# Patient Record
Sex: Male | Born: 1960 | Race: White | Hispanic: No | Marital: Married | Smoking: Former smoker
Health system: Southern US, Community
[De-identification: ages and names within clinical notes are randomized; demographics above are authoritative.]

## PROBLEM LIST (undated history)

## (undated) DIAGNOSIS — F419 Anxiety disorder, unspecified: Secondary | ICD-10-CM

## (undated) DIAGNOSIS — J309 Allergic rhinitis, unspecified: Secondary | ICD-10-CM

## (undated) DIAGNOSIS — R519 Headache, unspecified: Secondary | ICD-10-CM

## (undated) DIAGNOSIS — F909 Attention-deficit hyperactivity disorder, unspecified type: Secondary | ICD-10-CM

## (undated) DIAGNOSIS — G43909 Migraine, unspecified, not intractable, without status migrainosus: Secondary | ICD-10-CM

## (undated) HISTORY — DX: Allergic rhinitis, unspecified: J30.9

## (undated) HISTORY — DX: Anxiety disorder, unspecified: F41.9

## (undated) HISTORY — DX: Headache, unspecified: R51.9

## (undated) HISTORY — DX: Attention-deficit hyperactivity disorder, unspecified type: F90.9

## (undated) HISTORY — DX: Migraine, unspecified, not intractable, without status migrainosus: G43.909

---
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
## 1982-09-21 HISTORY — PX: KNEE SURGERY: SHX244

## 2013-11-17 LAB — HM COLONOSCOPY

## 2018-10-10 LAB — HM COLONOSCOPY

## 2019-04-03 HISTORY — PX: COLONOSCOPY: SHX174

## 2019-04-03 LAB — HM COLONOSCOPY

## 2020-08-07 ENCOUNTER — Ambulatory Visit (INDEPENDENT_AMBULATORY_CARE_PROVIDER_SITE_OTHER): Payer: BC Managed Care – PPO | Admitting: Family Medicine

## 2020-08-07 ENCOUNTER — Other Ambulatory Visit: Payer: Self-pay

## 2020-08-07 ENCOUNTER — Encounter: Payer: Self-pay | Admitting: Family Medicine

## 2020-08-07 VITALS — BP 119/70 | HR 80 | Temp 98.2°F | Resp 17 | Ht 73.5 in | Wt 207.6 lb

## 2020-08-07 DIAGNOSIS — R519 Headache, unspecified: Secondary | ICD-10-CM

## 2020-08-07 DIAGNOSIS — F419 Anxiety disorder, unspecified: Secondary | ICD-10-CM | POA: Insufficient documentation

## 2020-08-07 DIAGNOSIS — Z79899 Other long term (current) drug therapy: Secondary | ICD-10-CM | POA: Diagnosis not present

## 2020-08-07 DIAGNOSIS — Z0184 Encounter for antibody response examination: Secondary | ICD-10-CM | POA: Diagnosis not present

## 2020-08-07 DIAGNOSIS — Z7689 Persons encountering health services in other specified circumstances: Secondary | ICD-10-CM | POA: Insufficient documentation

## 2020-08-07 DIAGNOSIS — F909 Attention-deficit hyperactivity disorder, unspecified type: Secondary | ICD-10-CM | POA: Insufficient documentation

## 2020-08-07 NOTE — Patient Instructions (Signed)
Have your labs drawn and we will contact you with the results  We will plan to see you back in 3 months for your physical  You will receive a survey after today's visit either digitally by e-mail or paper by [place] mail. Your experiences and feedback matter to [place].  Please respond so we know how we are doing as we provide care for you.  Call us with any questions/concerns/needs.  It is my goal to be available to you for your health concerns.  Thanks for choosing me to be a partner in your healthcare needs!  Harlin Rain, FNP-C Family Nurse Practitioner [facility] Phone: [phone]

## 2020-08-07 NOTE — Assessment & Plan Note (Signed)
Currently stable and well controlled with PRN sumatriptan nasal spray.

## 2020-08-07 NOTE — Assessment & Plan Note (Signed)
New patient establishment at [facility] for primary care.  Previous PCP records to be requested.  Baseline labs ordered, to RTC in 3 months for CPE

## 2020-08-07 NOTE — Assessment & Plan Note (Signed)
TB skin test done today.  Labs for MMR and Hep B immunity ordered.

## 2020-08-07 NOTE — Progress Notes (Signed)
Subjective:    Patient ID: Ethan Glenn, male    DOB: 12-29-1960, 59 y.o.   MRN: [id number]  Ethan Glenn is a 59 y.o. male presenting on 08/07/2020 for Employment Physical   HPI  Mr. Troop presents to clinic as a new patient to establish for primary care.  Records will be requested from previous PCP office.  Past medical, family, and surgical history reviewed w/ pt.  Has acute concerns today for needing TB skin testing and labs for immunity for an employment physical.  No flowsheet data found.  Social History   Tobacco Use  . Smoking status: Former Smoker    Quit date: 08/07/1986    Years since quitting: 34.0  . Smokeless tobacco: Never Used  Vaping Use  . Vaping Use: Never used  Substance Use Topics  . Alcohol use: Yes    Alcohol/week: 3.0 standard drinks    Types: 3 Standard drinks or equivalent per week  . Drug use: Never    Review of Systems  Constitutional: Negative.   HENT: Negative.   Eyes: Negative.   Respiratory: Negative.   Cardiovascular: Negative.   Gastrointestinal: Negative.   Endocrine: Negative.   Genitourinary: Negative.   Musculoskeletal: Negative.   Skin: Negative.   Allergic/Immunologic: Negative.   Neurological: Negative.   Hematological: Negative.   Psychiatric/Behavioral: Negative.    Per HPI unless specifically indicated above     Objective:    BP 119/70 (BP Location: Right Arm, Patient Position: Sitting, Cuff Size: Large)   Pulse 80   Temp 98.2 F (36.8 C) (Oral)   Resp 17   Ht 6' 1.5" (1.867 m)   Wt 207 lb 9.6 oz (94.2 kg)   SpO2 100%   BMI 27.02 kg/m   Wt Readings from Last 3 Encounters:  08/07/20 207 lb 9.6 oz (94.2 kg)    Physical Exam Vitals and nursing note reviewed.  Constitutional:      General: He is not in acute distress.    Appearance: Normal appearance. He is well-developed and well-groomed. He is not ill-appearing or toxic-appearing.  HENT:     Head: Normocephalic and atraumatic.     Nose:      Comments: Lizbeth Bark is in place, covering mouth and nose. Eyes:     General:        Right eye: No discharge.        Left eye: No discharge.     Extraocular Movements: Extraocular movements intact.     Conjunctiva/sclera: Conjunctivae normal.     Pupils: Pupils are equal, round, and reactive to light.  Cardiovascular:     Rate and Rhythm: Normal rate and regular rhythm.     Pulses: Normal pulses.     Heart sounds: Normal heart sounds. No murmur heard.  No friction rub. No gallop.   Pulmonary:     Effort: Pulmonary effort is normal. No respiratory distress.     Breath sounds: Normal breath sounds.  Skin:    General: Skin is warm and dry.     Capillary Refill: Capillary refill takes less than 2 seconds.  Neurological:     General: No focal deficit present.     Mental Status: He is alert and oriented to person, place, and time.  Psychiatric:        Attention and Perception: Attention and perception normal.        Mood and Affect: Mood and affect normal.        Speech: Speech normal.  Behavior: Behavior normal. Behavior is cooperative.        Thought Content: Thought content normal.        Cognition and Memory: Cognition and memory normal.    No results found for this or any previous visit.    Assessment & Plan:   Problem List Items Addressed This Visit      Other   Encounter to establish care - Primary    New patient establishment at [facility] for primary care.  Previous PCP records to be requested.  Baseline labs ordered, to RTC in 3 months for CPE      Relevant Orders   PPD (Completed)   Anxiety    Currently stable and well controlled with escitalopram 42m daily.  Will continue.      Relevant Medications   escitalopram (LEXAPRO) 20 MG tablet   Other Relevant Orders   CBC with Differential   COMPLETE METABOLIC PANEL WITH GFR   TSH + free T4   Headache    Currently stable and well controlled with PRN sumatriptan nasal spray.      Relevant Medications    escitalopram (LEXAPRO) 20 MG tablet   SUMAtriptan (IMITREX) 20 MG/ACT nasal spray   Immunity status testing    TB skin test done today.  Labs for MMR and Hep B immunity ordered.      Relevant Orders   Measles/Mumps/Rubella Immunity   Hepatitis B core antibody, total    Other Visit Diagnoses    Long-term use of high-risk medication       Relevant Orders   Lipid Profile      No orders of the defined types were placed in this encounter.   Follow up plan: Return in about 3 months (around 11/07/2020) for CPE.   NHarlin Rain FHendersonvilleFamily Nurse Practitioner [facility] 08/07/2020, 11:25 AM

## 2020-08-07 NOTE — Assessment & Plan Note (Signed)
Currently stable and well controlled with escitalopram 20mg  daily.  Will continue.

## 2020-08-08 ENCOUNTER — Other Ambulatory Visit: Payer: Self-pay | Admitting: Family Medicine

## 2020-08-09 ENCOUNTER — Ambulatory Visit: Payer: Self-pay

## 2020-08-09 ENCOUNTER — Other Ambulatory Visit: Payer: Self-pay

## 2020-08-09 DIAGNOSIS — Z111 Encounter for screening for respiratory tuberculosis: Secondary | ICD-10-CM

## 2020-08-09 LAB — TEST AUTHORIZATION

## 2020-08-09 LAB — COMPLETE METABOLIC PANEL WITH GFR
AG Ratio: 1.7 (calc) (ref 1.0–2.5)
ALT: 27 U/L (ref 9–46)
AST: 20 U/L (ref 10–35)
Albumin: 4.3 g/dL (ref 3.6–5.1)
Alkaline phosphatase (APISO): 79 U/L (ref 35–144)
BUN: 12 mg/dL (ref 7–25)
CO2: 25 mmol/L (ref 20–32)
Calcium: 10.5 mg/dL — ABNORMAL HIGH (ref 8.6–10.3)
Chloride: 104 mmol/L (ref 98–110)
Creat: 1.06 mg/dL (ref 0.70–1.33)
GFR, Est African American: 90 mL/min/{1.73_m2} (ref >=60)
GFR, Est Non African American: 78 mL/min/{1.73_m2} (ref >=60)
Globulin: 2.5 g/dL (calc) (ref 1.9–3.7)
Glucose, Bld: 113 mg/dL — ABNORMAL HIGH (ref 65–99)
Potassium: 4.7 mmol/L (ref 3.5–5.3)
Sodium: 138 mmol/L (ref 135–146)
Total Bilirubin: 1.2 mg/dL (ref 0.2–1.2)
Total Protein: 6.8 g/dL (ref 6.1–8.1)

## 2020-08-09 LAB — CBC WITH DIFFERENTIAL/PLATELET
Absolute Monocytes: 488 cells/uL (ref 200–950)
Basophils Absolute: 42 cells/uL (ref 0–200)
Basophils Relative: 0.8 %
Eosinophils Absolute: 90 cells/uL (ref 15–500)
Eosinophils Relative: 1.7 %
HCT: 45.6 % (ref 38.5–50.0)
Hemoglobin: 14.8 g/dL (ref 13.2–17.1)
Lymphs Abs: 1399 cells/uL (ref 850–3900)
MCH: 28.8 pg (ref 27.0–33.0)
MCHC: 32.5 g/dL (ref 32.0–36.0)
MCV: 88.9 fL (ref 80.0–100.0)
MPV: 11.2 fL (ref 7.5–12.5)
Monocytes Relative: 9.2 %
Neutro Abs: 3281 cells/uL (ref 1500–7800)
Neutrophils Relative %: 61.9 %
Platelets: 357 10*3/uL (ref 140–400)
RBC: 5.13 10*6/uL (ref 4.20–5.80)
RDW: 12.4 % (ref 11.0–15.0)
Total Lymphocyte: 26.4 %
WBC: 5.3 10*3/uL (ref 3.8–10.8)

## 2020-08-09 LAB — HEPATITIS B CORE ANTIBODY, TOTAL: Hep B Core Total Ab: NONREACTIVE

## 2020-08-09 LAB — LIPID PANEL
Cholesterol: 200 mg/dL — ABNORMAL HIGH (ref <200)
HDL: 62 mg/dL (ref >=40)
LDL Cholesterol (Calc): 116 mg/dL (calc) — ABNORMAL HIGH
Non-HDL Cholesterol (Calc): 138 mg/dL (calc) — ABNORMAL HIGH (ref <130)
Total CHOL/HDL Ratio: 3.2 (calc) (ref <5.0)
Triglycerides: 112 mg/dL (ref <150)

## 2020-08-09 LAB — HEPATITIS B SURFACE ANTIBODY,QUALITATIVE: Hep B S Ab: REACTIVE — AB

## 2020-08-09 LAB — MEASLES/MUMPS/RUBELLA IMMUNITY
Mumps IgG: 258 AU/mL
Rubella: 23.2 Index
Rubeola IgG: 73 AU/mL

## 2020-08-09 LAB — TB SKIN TEST
Induration: 0 mm
TB Skin Test: NEGATIVE

## 2020-08-09 LAB — TSH+FREE T4: TSH W/REFLEX TO FT4: 1.94 mIU/L (ref 0.40–4.50)

## 2020-11-12 ENCOUNTER — Ambulatory Visit: Payer: Self-pay

## 2020-11-12 ENCOUNTER — Telehealth: Payer: Self-pay

## 2020-11-12 NOTE — Telephone Encounter (Signed)
Copied from [place] [id number]. Topic: Appointment Scheduling - Scheduling Inquiry for Clinic >> Nov 12, 2020  2:53 PM Oneta Rack wrote: Reason for CRM:  patient was not aware Ethan Glenn was no longer with the practice. Patient states he is happy with the service he has been receiving and would like to remain as a patient at [facility]. Please advise the patient directly regarding what is needed to remain as a patient. Patient wife is also a patient Ethan Glenn (Spouse)

## 2020-11-12 NOTE — Telephone Encounter (Signed)
Attempted to contact the patient to f/u on recent message. If the patient calls back please inform patient that we plan on keeping all [facility]  Current patient. We are in the process of hiring a new provider that will take over [facility] patient load.

## 2020-11-12 NOTE — Telephone Encounter (Signed)
Patient called and says he was diagnosed with COVID on 11/07/20 after onset of symptoms on 11/06/20-mild cough, scratchy throat. He says he also felt like mild flu symptom with temp as high as 99.6. He says today is his 5th day of quarantine and is supposed to go back to work tomorrow. He says he has not taken any medication in the past 24 hours and he was feeling better. He says he's calling because he went up the stairs in his home and says it was hard for him to catch his breath. He says his HR was up to low-mid 100's. He says it concerned him because all these 5 days he has not been SOB. He says he's supposed to go back to work, so needs to know if he should or not. I advised based on CDC guidelines if during the 5 days symptoms are not improving or get worse, to stay home additional 5 days. Advised virtual appointment tomorrow at 1400 with Dr. Parks Ranger, care advice given, patient verbalized understanding.  Reason for Disposition . MILD difficulty breathing (e.g., minimal/no SOB at rest, SOB with walking, pulse <100)  Answer Assessment - Initial Assessment Questions 1. COVID-19 DIAGNOSIS: "Who made your COVID-19 diagnosis?" "Was it confirmed by a positive lab test or self-test?" If not diagnosed by a doctor (or NP/PA), ask "Are there lots of cases (community spread) where you live?" Note: See public health department website, if unsure.     [facility] lab test 2. COVID-19 EXPOSURE: "Was there any known exposure to COVID before the symptoms began?" CDC Definition of close contact: within 6 feet (2 meters) for a total of 15 minutes or more over a 24-hour period.      Maybe my wife, not sure, her tests negative 3. ONSET: "When did the COVID-19 symptoms start?"      11/06/20-little cough, scratchy throat 4. WORST SYMPTOM: "What is your worst symptom?" (e.g., cough, fever, shortness of breath, muscle aches)     Shortness of breath 5. COUGH: "Do you have a cough?" If Yes, ask: "How bad is the cough?"        Yes 6. FEVER: "Do you have a fever?" If Yes, ask: "What is your temperature, how was it measured, and when did it start?"     No fever at this time 7. RESPIRATORY STATUS: "Describe your breathing?" (e.g., shortness of breath, wheezing, unable to speak)      Shortness of breath with activity starting today 8. BETTER-SAME-WORSE: "Are you getting better, staying the same or getting worse compared to yesterday?"  If getting worse, ask, "In what way?"     Better 9. HIGH RISK DISEASE: "Do you have any chronic medical problems?" (e.g., asthma, heart or lung disease, weak immune system, obesity, etc.)     No 10. VACCINE: "Have you had the COVID-19 vaccine?" If Yes, ask: "Which one, how many shots, when did you get it?"       3 shots 11. BOOSTER: "Have you received your COVID-19 booster?" If Yes, ask: "Which one and when did you get it?"       Moderna 12. PREGNANCY: "Is there any chance you are pregnant?" "When was your last menstrual period?"       N/A 13. OTHER SYMPTOMS: "Do you have any other symptoms?"  (e.g., chills, fatigue, headache, loss of smell or taste, muscle pain, sore throat)      Fatigue, slight cough, scratchy throat, post nasal drip, sinus congestion, runny nose 14. O2 SATURATION MONITOR:  "  Do you use an oxygen saturation monitor (pulse oximeter) at home?" If Yes, ask "What is your reading (oxygen level) today?" "What is your usual oxygen saturation reading?" (e.g., 95%)       Yes on my Apple watch, 100%  Protocols used: CORONAVIRUS (COVID-19) DIAGNOSED OR SUSPECTED-A-AH

## 2020-11-13 ENCOUNTER — Other Ambulatory Visit: Payer: Self-pay

## 2020-11-13 ENCOUNTER — Ambulatory Visit (INDEPENDENT_AMBULATORY_CARE_PROVIDER_SITE_OTHER): Payer: HRSA Program | Admitting: Family Medicine

## 2020-11-13 ENCOUNTER — Encounter: Payer: Self-pay | Admitting: Family Medicine

## 2020-11-13 VITALS — HR 140 | Ht 73.5 in | Wt 207.0 lb

## 2020-11-13 DIAGNOSIS — R000 Tachycardia, unspecified: Secondary | ICD-10-CM | POA: Diagnosis not present

## 2020-11-13 DIAGNOSIS — R0600 Dyspnea, unspecified: Secondary | ICD-10-CM

## 2020-11-13 DIAGNOSIS — U071 COVID-19: Secondary | ICD-10-CM | POA: Diagnosis not present

## 2020-11-13 DIAGNOSIS — R0609 Other forms of dyspnea: Secondary | ICD-10-CM

## 2020-11-13 NOTE — Progress Notes (Signed)
Virtual Visit via Telephone The purpose of this virtual visit is to provide medical care while limiting exposure to the novel coronavirus (COVID19) for both patient and office staff.  Consent was obtained for phone visit:  Yes.   Answered questions that patient had about telehealth interaction:  Yes.   I discussed the limitations, risks, security and privacy concerns of performing an evaluation and management service by telephone. I also discussed with the patient that there may be a patient responsible charge related to this service. The patient expressed understanding and agreed to proceed.  Patient Location: Home Provider Location: [facility] (Office)  Participants in virtual visit: - Patient: Ethan Glenn - CMA: Orinda Kenner, [place] - Provider: Dr Parks Ranger  ---------------------------------------------------------------------- Chief Complaint  Patient presents with   Cough   Irregular Heart Beat   Shortness of Breath    S: Reviewed CMA documentation. 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 Symptoms started 11/06/20 with cough and sore throat scratchy He did a test through work on 11/07/20, PCR swab was POSITIVE Sick contact with wife, she did home test x 2 negative. His symptoms evolved into mild flu, with low grade fever, and aches, now has resolved fever with highest of 98.66F, without any Tylenol or fever reducing medicine. Currently improved, still has congestion of L sinus, and post nasal drainage. Yesterday he was doing laundry and he was carrying it upstairs, he felt his heart was racing at that time. He has a smart watch that was monitoring his HR and Oxygen saturation, he has had a few episodes of Pulse ox reduced to 85% at times then repeat check briefly was >95%. Also he  said his heart was racing up to HR 140 today after shower, took a few minutes for it to improve. He finds himself taking deeper breaths while talking. He was supposed to go back to work today. But he wanted to be extra cautious. Seems symptoms have plateau Denies any tightness in chest or chest pain  Denies any shortness of breath  He has chronic anxiety. Takes Escitalopram 20mg  daily.  He is in good health overall. Normally can exercise for longer duration. He has no history of Hypertension  Denies any fevers, chills, sweats, body ache, cough, sinus pain or pressure, headache, abdominal pain, diarrhea  Past Medical History:  Diagnosis Date   ADHD    Allergic rhinitis    Anxiety    Frequent headaches    Migraine    Social History   Tobacco Use   Smoking status: Former Smoker    Quit date: 08/07/1986    Years since quitting: 34.2   Smokeless tobacco: Never Used  Vaping Use   Vaping Use: Never used  Substance Use Topics   Alcohol use: Yes    Alcohol/week: 3.0 standard drinks    Types: 3 Standard drinks or equivalent per week   Drug use: Never    Current Outpatient Medications:    escitalopram (LEXAPRO) 20 MG tablet, Take 20 mg by mouth daily., Disp: , Rfl:    SUMAtriptan (IMITREX) 20 MG/ACT nasal spray, Place 20 mg into the nose every 2 (two) hours as needed for migraine or headache. May repeat in 2 hours if headache persists or recurs., Disp: , Rfl:   No flowsheet data found.  No flowsheet data found.  --------------------------------------------------------------------------  O: No physical exam performed due to remote telephone encounter.  Lab results reviewed.  No results found for this or any previous visit (from the past 2160 hour(s)).  -------------------------------------------------------------------------- A&P:  Problem List Items Addressed This Visit   None   Visit Diagnoses    COVID-19 virus infection    -  Primary   Dyspnea on  exertion       Tachycardia         Clinically with recent COVID19 infection 1st symptoms 2/16 Tested positive 2/17, PCR through work, school system Now recovered mostly with resolved fever and overall improved symptoms But still has lingering dyspnea on exertion and reflexive tachycardia, brief episodes but still impacting his daily function, he is not able to return to work based on these symptoms, even though cleared from covid quarantine without fever.  No other concerning features to suggest other complication, history not supportive of pneumonia or other etiology.  Reassurance. Considered breathing treatments, defer for now, he is not interested. Monitor HR / Oxygen if able, may have very slight reduced oxygen only during significant exertion.  Note written for work excuse weds 2/23 - sun 2/27, return mon 2/28, sent to his newly activated mychart. Otherwise may need to email to [email or web address]   If gradually improves no further testing. If not improving would offer Chest X-ray and follow-up, possible Cardiology consultation if exercise symptoms persist in future  No orders of the defined types were placed in this encounter.   Follow-up: - Return in 1 week as needed if unresolved  Patient verbalizes understanding with the above medical recommendations including the limitation of remote medical advice.  Specific follow-up and call-back criteria were given for patient to follow-up or seek medical care more urgently if needed.   - Time spent in direct consultation with patient on phone: 15 minutes   Nobie Putnam, [facility] 11/13/2020, 2:26 PM

## 2020-11-16 ENCOUNTER — Ambulatory Visit
Admission: RE | Admit: 2020-11-16 | Discharge: 2020-11-16 | Disposition: A | Discharge status: Home / Self Care | Payer: BC Managed Care – PPO | Source: Ambulatory Visit | Attending: Family Medicine | Admitting: Family Medicine

## 2020-11-16 ENCOUNTER — Other Ambulatory Visit: Payer: Self-pay

## 2020-11-16 VITALS — BP 129/85 | HR 85 | Temp 98.5°F | Resp 16 | Ht 74.0 in | Wt 205.0 lb

## 2020-11-16 DIAGNOSIS — R0981 Nasal congestion: Secondary | ICD-10-CM

## 2020-11-16 DIAGNOSIS — R448 Other symptoms and signs involving general sensations and perceptions: Secondary | ICD-10-CM

## 2020-11-16 NOTE — ED Triage Notes (Signed)
Patient states that he had COVID on 11/07/20.  Patient c/o sinus congestion and pressure for a week Patient denies recent fevers.

## 2020-11-16 NOTE — Discharge Instructions (Addendum)
Flonase nasal spray.  Continue with nasal spray.  Mucinex as an expectorant may be helpful.  Push fluids to ensure adequate hydration and keep secretions thin.  Tylenol and/or ibuprofen as needed for pain or fevers.  If symptoms worsen or do not improve in the next week to return to be seen or to follow up with your PCP.

## 2020-11-16 NOTE — ED Provider Notes (Signed)
[facility]    CSN: [id number] Arrival date & time: 11/16/20  1249      History   Chief Complaint Chief Complaint  Patient presents with  . Sinus Problem    HPI Ethan Glenn is a 60 y.o. male.   Ethan Glenn presents with complaints of persistent sinus congestion and pressure. Last night woke due to pain behind right eye, even. Pain will radiate into teeth at times. This morning was able to get some nasal drainage out, otherwise he feels like congestion is sitting in sinuses rather than draining. Positive for covid-19 on 2/17, with nasal drainage starting around a week ago. He has not had any new fevers. Cough has been improving. Still feels fatigue but it is improving. No sore throat. He just still feels unwell which is atypical for him. He has been using nasal saline which has helped some. Originally in illness he was using nyquil which he feels was causing too much dryness/ congestion so he has since stopped this.    ROS per HPI, negative if not otherwise mentioned.      Past Medical History:  Diagnosis Date  . ADHD   . Allergic rhinitis   . Anxiety   . Frequent headaches   . Migraine     Patient Active Problem List   Diagnosis Date Noted  . Encounter to establish care 08/07/2020  . Anxiety 08/07/2020  . ADHD 08/07/2020  . Headache 08/07/2020  . Immunity status testing 08/07/2020    Past Surgical History:  Procedure Laterality Date  . HERNIA REPAIR  1965  . KNEE SURGERY Left 1984       Home Medications    Prior to Admission medications   Medication Sig Start Date End Date Taking? Authorizing Provider  escitalopram (LEXAPRO) 20 MG tablet Take 20 mg by mouth daily. 07/01/20  Yes [provider]  SUMAtriptan (IMITREX) 20 MG/ACT nasal spray Place 20 mg into the nose every 2 (two) hours as needed for migraine or headache. May repeat in 2 hours if headache persists or recurs.   Yes [provider]    Family History Family  History  Problem Relation Age of Onset  . Hypertension Father   . Stroke Father     Social History Social History   Tobacco Use  . Smoking status: Former Smoker    Quit date: 08/07/1986    Years since quitting: 34.3  . Smokeless tobacco: Never Used  Vaping Use  . Vaping Use: Never used  Substance Use Topics  . Alcohol use: Yes    Alcohol/week: 3.0 standard drinks    Types: 3 Standard drinks or equivalent per week  . Drug use: Never     Allergies   Erythromycin   Review of Systems Review of Systems   Physical Exam Triage Vital Signs ED Triage Vitals  Enc Vitals Group     BP 11/16/20 1300 129/85     Pulse Rate 11/16/20 1300 85     Resp 11/16/20 1300 16     Temp 11/16/20 1300 98.5 F (36.9 C)     Temp Source 11/16/20 1300 Oral     SpO2 11/16/20 1300 99 %     Weight 11/16/20 1259 205 lb (93 kg)     Height 11/16/20 1259 6\' 2"  (1.88 m)     Head Circumference --      Peak Flow --      Pain Score 11/16/20 1412 4     Pain Loc --  Pain Edu? --      Excl. in [place]? --    No data found.  Updated Vital Signs BP 129/85 (BP Location: Left Arm)   Pulse 85   Temp 98.5 F (36.9 C) (Oral)   Resp 16   Ht 6\' 2"  (1.88 m)   Wt 205 lb (93 kg)   SpO2 99%   BMI 26.32 kg/m   Visual Acuity Right Eye Distance:   Left Eye Distance:   Bilateral Distance:    Right Eye Near:   Left Eye Near:    Bilateral Near:     Physical Exam Constitutional:      Appearance: He is well-developed.  HENT:     Head:     Comments: Mild maxillary sinus tenderness on palpation     Right Ear: Tympanic membrane and ear canal normal.     Left Ear: Tympanic membrane and ear canal normal.  Cardiovascular:     Rate and Rhythm: Normal rate and regular rhythm.  Pulmonary:     Effort: Pulmonary effort is normal.     Breath sounds: Normal breath sounds.  Skin:    General: Skin is warm and dry.  Neurological:     Mental Status: He is alert and oriented to person, place, and time.       UC Treatments / Results  Labs (all labs ordered are listed, but only abnormal results are displayed) Labs Reviewed - No data to display  EKG   Radiology No results found.  Procedures Procedures (including critical care time)  Medications Ordered in UC Medications - No data to display  Initial Impression / Assessment and Plan / UC Course  I have reviewed the triage vital signs and the nursing notes.  Pertinent labs & imaging results that were available during my care of the patient were reviewed by me and considered in my medical decision making (see chart for details).     ~ 10 days of covid illness. Generally improving, but persistent sinus symptoms. Not worsening. No new fevers. Continue with over the counter medications for symptoms, no obvious indication of bacterial sinusitis at this time. Return precautions provided. Patient verbalized understanding and agreeable to plan.   Final Clinical Impressions(s) / UC Diagnoses   Final diagnoses:  Nasal congestion  Facial pressure     Discharge Instructions     Flonase nasal spray.  Continue with nasal spray.  Mucinex as an expectorant may be helpful.  Push fluids to ensure adequate hydration and keep secretions thin.  Tylenol and/or ibuprofen as needed for pain or fevers.  If symptoms worsen or do not improve in the next week to return to be seen or to follow up with your PCP.     ED Prescriptions    None     PDMP not reviewed this encounter.   Zigmund Gottron, NP 11/16/20 1434

## 2021-01-16 ENCOUNTER — Ambulatory Visit: Payer: Self-pay | Admitting: Family Medicine

## 2021-01-17 ENCOUNTER — Other Ambulatory Visit: Payer: Self-pay

## 2021-01-17 ENCOUNTER — Ambulatory Visit: Payer: 59 | Admitting: Family Medicine

## 2021-01-17 ENCOUNTER — Encounter: Payer: Self-pay | Admitting: Family Medicine

## 2021-01-17 ENCOUNTER — Other Ambulatory Visit: Payer: Self-pay | Admitting: Family Medicine

## 2021-01-17 VITALS — BP 122/74 | HR 81 | Temp 98.2°F | Ht 74.0 in | Wt 206.0 lb

## 2021-01-17 DIAGNOSIS — F419 Anxiety disorder, unspecified: Secondary | ICD-10-CM

## 2021-01-17 DIAGNOSIS — M79644 Pain in right finger(s): Secondary | ICD-10-CM | POA: Diagnosis not present

## 2021-01-17 DIAGNOSIS — R29818 Other symptoms and signs involving the nervous system: Secondary | ICD-10-CM

## 2021-01-17 DIAGNOSIS — M65321 Trigger finger, right index finger: Secondary | ICD-10-CM | POA: Diagnosis not present

## 2021-01-17 DIAGNOSIS — M19042 Primary osteoarthritis, left hand: Secondary | ICD-10-CM

## 2021-01-17 DIAGNOSIS — M65351 Trigger finger, right little finger: Secondary | ICD-10-CM | POA: Diagnosis not present

## 2021-01-17 DIAGNOSIS — M19041 Primary osteoarthritis, right hand: Secondary | ICD-10-CM | POA: Insufficient documentation

## 2021-01-17 DIAGNOSIS — M65352 Trigger finger, left little finger: Secondary | ICD-10-CM

## 2021-01-17 DIAGNOSIS — M65332 Trigger finger, left middle finger: Secondary | ICD-10-CM

## 2021-01-17 DIAGNOSIS — M65322 Trigger finger, left index finger: Secondary | ICD-10-CM

## 2021-01-17 DIAGNOSIS — M65312 Trigger thumb, left thumb: Secondary | ICD-10-CM

## 2021-01-17 DIAGNOSIS — M65342 Trigger finger, left ring finger: Secondary | ICD-10-CM

## 2021-01-17 DIAGNOSIS — M65311 Trigger thumb, right thumb: Secondary | ICD-10-CM

## 2021-01-17 DIAGNOSIS — M65341 Trigger finger, right ring finger: Secondary | ICD-10-CM

## 2021-01-17 DIAGNOSIS — M79645 Pain in left finger(s): Secondary | ICD-10-CM | POA: Insufficient documentation

## 2021-01-17 DIAGNOSIS — M65331 Trigger finger, right middle finger: Secondary | ICD-10-CM

## 2021-01-17 DIAGNOSIS — G8929 Other chronic pain: Secondary | ICD-10-CM

## 2021-01-17 MED ORDER — ESCITALOPRAM OXALATE 20 MG PO TABS: 20.0000 mg | ORAL_TABLET | Freq: Every day | ORAL | 3 refills | Status: DC

## 2021-01-17 NOTE — Progress Notes (Signed)
Subjective:    Patient ID: Ethan Glenn, male    DOB: May 16, 1961, 60 y.o.   MRN: [id number]  Zane Pellecchia is a 60 y.o. male presenting on 01/17/2021 for Arthritis (X5 years, Both hands, pt has had shots in thumb and pinky, getting worse,cant manage pain much)   HPI   Bilateral Hand Arthritis Trigger finger, including R 5th finger R Hand dominant Reports that bilateral hands with stiffness, joint pain. Primarily described difficulty with bilateral thumb pain, can be L and R, often can cause significant pain. Bilateral 2nd-4th fingers both hands with some triggering. Also R hand 5th finger trigger impacting him. He has some mild 2-3 out of 10 aching. Pain does not seem to bother him as much, mostly it is the function with reduced mobility range and triggering.  He takes occasional OTC Naproxen PRN. Worse if flared up from activity.  Previously has seen Dr Verita Lamb at [facility], last 10/2020, he was not pleased with last injection, previously they were using ultrasound machine in past back in [place]. Limited results   He has had x-rays in the past, at [place]. He has identified bone spur  He is a retired Radio producer, and has done a Medical laboratory scientific officer. He believes gradual progression - Currently doing a lot of painting, gardening, repetitive - Fam history with mother of degenerative osteoarthritis  Additionally he needs refill on Lexapro for anxiety. Taking 20mg  daily doing well.  History of COVID Daytime Sleepiness Restlessness at night Possible Hypoxia overnight - measured on his smart watch  Oxygen question, with overnight oxygen lower on his smart watch Not sleeping as well overnight He has woken up with a hand cramp before Feeling good during the day, some sleepiness or feeling tired only at times   Epworth Sleepiness Scale Total Score: 9 Sitting and reading -3 Watching TV - 0 Sitting inactive in a public place - 0 As a  passenger in a car for an hour without a break - 3 Lying down to rest in the afternoon when circumstances permit - 3 Sitting and talking to someone - 0 Sitting quietly after a lunch without alcohol - 0 In a car, while stopped for a few minutes in traffic - 0   STOP-Bang OSA scoring Snoring yes   Tiredness yes   Observed apneas no   Pressure HTN no   BMI > 35 kg/m2 no   Age > 44  yes   Neck (male >17 in; Male >16 in)  no 41"  Gender male yes   OSA risk low (0-2)  OSA risk intermediate (3-4)  OSA risk high (5+)  Total: 4 intermediate     Depression screen [facility] 2/9 01/17/2021  Decreased Interest 0  Down, Depressed, Hopeless 0  PHQ - 2 Score 0  Altered sleeping 1  Tired, decreased energy 1  Change in appetite 0  Feeling bad or failure about yourself  0  Trouble concentrating 0  Moving slowly or fidgety/restless 0  Suicidal thoughts 1  PHQ-9 Score 3    Social History   Tobacco Use  . Smoking status: Former Smoker    Quit date: 08/07/1986    Years since quitting: 34.4  . Smokeless tobacco: Never Used  Vaping Use  . Vaping Use: Never used  Substance Use Topics  . Alcohol use: Yes    Alcohol/week: 3.0 standard drinks    Types: 3 Standard drinks or equivalent per week  . Drug use: Never  Review of Systems Per HPI unless specifically indicated above     Objective:    BP 122/74   Pulse 81   Temp 98.2 F (36.8 C) (Oral)   Ht 6\' 2"  (1.88 m)   Wt 206 lb (93.4 kg)   SpO2 100%   BMI 26.45 kg/m   Wt Readings from Last 3 Encounters:  01/17/21 206 lb (93.4 kg)  11/16/20 205 lb (93 kg)  11/13/20 207 lb (93.9 kg)    Physical Exam Vitals and nursing note reviewed.  Constitutional:      General: He is not in acute distress.    Appearance: He is well-developed. He is not diaphoretic.     Comments: Well-appearing, comfortable, cooperative  HENT:     Head: Normocephalic and atraumatic.  Eyes:     General:        Right eye: No discharge.        Left eye: No  discharge.     Conjunctiva/sclera: Conjunctivae normal.  Cardiovascular:     Rate and Rhythm: Normal rate.  Pulmonary:     Effort: Pulmonary effort is normal.  Musculoskeletal:     Comments: Bilateral hands with multiple joints with some slight increased bulkiness especially PIP multiple fingers, CMC joints with some reproduced pain on range of motion, has some stiffness of finger joints now improved since active. Some slight distal triggering both hands. L 5th finger with triggering and pain.  Skin:    General: Skin is warm and dry.     Findings: No erythema or rash.  Neurological:     Mental Status: He is alert and oriented to person, place, and time.  Psychiatric:        Behavior: Behavior normal.     Comments: Well groomed, good eye contact, normal speech and thoughts        Results for orders placed or performed in visit on 08/07/20  CBC with Differential  Result Value Ref Range   WBC 5.3 3.8 - 10.8 Thousand/uL   RBC 5.13 4.20 - 5.80 Million/uL   Hemoglobin 14.8 13.2 - 17.1 g/dL   HCT 45.6 38.5 - 50.0 %   MCV 88.9 80.0 - 100.0 fL   MCH 28.8 27.0 - 33.0 pg   MCHC 32.5 32.0 - 36.0 g/dL   RDW 12.4 11.0 - 15.0 %   Platelets 357 140 - 400 Thousand/uL   MPV 11.2 7.5 - 12.5 fL   Neutro Abs 3,281 1,500 - 7,800 cells/uL   Lymphs Abs 1,399 850 - 3,900 cells/uL   Absolute Monocytes 488 200 - 950 cells/uL   Eosinophils Absolute 90 15 - 500 cells/uL   Basophils Absolute 42 0 - 200 cells/uL   Neutrophils Relative % 61.9 %   Total Lymphocyte 26.4 %   Monocytes Relative 9.2 %   Eosinophils Relative 1.7 %   Basophils Relative 0.8 %  COMPLETE METABOLIC PANEL WITH GFR  Result Value Ref Range   Glucose, Bld 113 (H) 65 - 99 mg/dL   BUN 12 7 - 25 mg/dL   Creat 1.06 0.70 - 1.33 mg/dL   GFR, Est Non African American 78 > OR = 60 mL/min/1.70m2   GFR, Est African American 90 > OR = 60 mL/min/1.93m2   BUN/Creatinine Ratio NOT APPLICABLE 6 - 22 (calc)   Sodium 138 135 - 146 mmol/L    Potassium 4.7 3.5 - 5.3 mmol/L   Chloride 104 98 - 110 mmol/L   CO2 25 20 - 32 mmol/L   Calcium  10.5 (H) 8.6 - 10.3 mg/dL   Total Protein 6.8 6.1 - 8.1 g/dL   Albumin 4.3 3.6 - 5.1 g/dL   Globulin 2.5 1.9 - 3.7 g/dL (calc)   AG Ratio 1.7 1.0 - 2.5 (calc)   Total Bilirubin 1.2 0.2 - 1.2 mg/dL   Alkaline phosphatase (APISO) 79 35 - 144 U/L   AST 20 10 - 35 U/L   ALT 27 9 - 46 U/L  Lipid Profile  Result Value Ref Range   Cholesterol 200 (H) <200 mg/dL   HDL 62 > OR = 40 mg/dL   Triglycerides 112 <150 mg/dL   LDL Cholesterol (Calc) 116 (H) mg/dL (calc)   Total CHOL/HDL Ratio 3.2 <5.0 (calc)   Non-HDL Cholesterol (Calc) 138 (H) <130 mg/dL (calc)  TSH + free T4  Result Value Ref Range   TSH W/REFLEX TO FT4 1.94 0.40 - 4.50 mIU/L  Measles/Mumps/Rubella Immunity  Result Value Ref Range   Rubeola IgG 73.00 AU/mL   Mumps IgG 258.00 AU/mL   Rubella 23.20 Index  Hepatitis B core antibody, total  Result Value Ref Range   Hep B Core Total Ab NON-REACTIVE NON-REACTI  Hepatitis B surface antibody,qualitative  Result Value Ref Range   Hep B S Ab REACTIVE (A) NON-REACTI  TEST AUTHORIZATION  Result Value Ref Range   TEST NAME: HEPATITIS B SURFACE    TEST CODE: 499XLL3    CLIENT CONTACT: KELITA RUSSELL    REPORT ALWAYS MESSAGE SIGNATURE    PPD  Result Value Ref Range   TB Skin Test Negative    Induration 0 mm      Assessment & Plan:   Problem List Items Addressed This Visit    Trigger finger of all digits of both hands   Relevant Orders   Ambulatory referral to Orthopedic Surgery   Primary osteoarthritis of both hands - Primary   Relevant Orders   Ambulatory referral to Orthopedic Surgery   Chronic thumb pain, bilateral   Relevant Orders   Ambulatory referral to Orthopedic Surgery   Acquired trigger finger of right little finger   Relevant Orders   Ambulatory referral to Orthopedic Surgery    Other Visit Diagnoses    Suspected sleep apnea          #Bilateral Hands See  diagnoses above  Chronic worsening >15+ years of osteoarthritis bilateral hands with thumbs with CMC joint pain stiffness, and other digits both hands 2nd-4th finger stiffness and some early triggering DIP. Left 5th finger little finger with significant triggering and pain. He has seen prior Orthopedic Dr Leretha Pol here in [place] but did not do as well with L finger injection, he is now interested in 2nd opinion from other provider, and interested in other procedural options.  - Will place referral to Dr Roseanne Kaufman [facility]  Additional treatment advice per AVS - Recommend OTC Voltaren, Tylenol regimen and can use oral NSAID PRN. May try Thumb Spica splint PRN some activities  ----------  #Suspected sleep apnea w/ some overnight hypoxia post COVID  New problem with post COVID with home readings on smart watch with some possible hypoxia overnight with readings in lower 29J, uncertain if clinically correlated but he has been restless sleepign at night and has been more tired during day.  - Screening: ESS score 9 / STOP-Bang Score 4 intermediate - Neck Circumference: 16" negative - Co-morbidities: None  Plan: 1. Discussion on initial diagnosis and testing for OSA, risk factors, management, complications 2. Agree to proceed  with sleep study testing based on clinical concerns - will fax referral to [facility] - to arrange initial PSG home vs in sleep lab and further evaluation.   No orders of the defined types were placed in this encounter.  Orders Placed This Encounter  Procedures  . Ambulatory referral to Orthopedic Surgery    Referral Priority:   Routine    Referral Type:   Surgical    Referral Reason:   Specialty Services Required    Requested Specialty:   Orthopedic Surgery    Number of Visits Requested:   1       Follow up plan: Return if symptoms worsen or fail to improve.    Nobie Putnam, [facility] 01/17/2021, 1:47 PM

## 2021-01-17 NOTE — Patient Instructions (Addendum)
Thank you for coming to the office today.  Consider Thumb Spica Splint for some activities.  START anti inflammatory topical - OTC Voltaren (generic Diclofenac) topical 1-4 times a day as needed for pain swelling of affected joint for 1-2 weeks or longer.  Daily aches and pains, not for inflammation Recommend to start taking Tylenol Extra Strength 500mg  tabs - take 1 to 2 tabs per dose (max 1000mg ) every 6-8 hours for pain, max 24 hour daily dose is 6 tablets or 3000mg . In the future you can repeat the same everyday Tylenol course for 1-2 weeks at a time.  If needed for inflammation or flare up Recommend trial of Anti-inflammatory with Naproxen (Aleve OTC ) 220 or 250mg  tabs - take one with food and plenty of water TWICE daily every day (breakfast and dinner), for next 1 to 2 weeks, then you may take only as needed  [place] - will fax orders  Dr Roseanne Kaufman III  [facility]   Please schedule a Follow-up Appointment to: Return if symptoms worsen or fail to improve.  If you have any other questions or concerns, please feel free to call the office or send a message through [facility]. You may also schedule an earlier appointment if necessary.  Additionally, you may be receiving a survey about your experience at our office within a few days to 1 week by e-mail or mail. We value your feedback.  Nobie Putnam, DO [place]

## 2021-01-28 ENCOUNTER — Telehealth: Payer: Self-pay

## 2021-01-28 DIAGNOSIS — R29818 Other symptoms and signs involving the nervous system: Secondary | ICD-10-CM

## 2021-01-28 DIAGNOSIS — G4734 Idiopathic sleep related nonobstructive alveolar hypoventilation: Secondary | ICD-10-CM

## 2021-01-28 NOTE — Telephone Encounter (Signed)
Last visit 4/29. I completed a referral to [facility] for sleep study.  This new request sounds like patient is requesting to go elsewhere.  New referral placed in Epic now.   Nobie Putnam, [facility] 01/28/2021, 7:42 PM

## 2021-01-28 NOTE — Telephone Encounter (Signed)
Copied from [place] [phone]. Topic: Referral - Request for Referral >> Jan 28, 2021 11:54 AM Tessa Lerner A wrote: Has patient seen PCP for this complaint? Yes  *If NO, is insurance requiring patient see PCP for this issue before PCP can refer them?  Referral for which specialty: Sleep Disorders  Preferred provider/office: [facility]  Reason for referral: Patient would like to have a sleep study

## 2021-02-13 ENCOUNTER — Telehealth: Payer: Self-pay

## 2021-02-13 NOTE — Telephone Encounter (Signed)
Copied from [place] #[id number]. Topic: General - Other >> Feb 10, 2021 12:04 PM Tessa Lerner A wrote: Reason for CRM: Patient was notified that results of their sleep study were available   The patient was directed to follow up with their PCP regarding the results  The patient would like to be contacted to discuss them further when possible  Please contact when available >> Feb 11, 2021  4:10 PM Keene Breath wrote: Patient is calling again to get his results from his sleep study test.  Please advise.   Please see previous message. Sleep study results are not available. The pt was notified of this by [facility] and our office.

## 2021-02-13 NOTE — Telephone Encounter (Signed)
Copied from [place] [id number]. Topic: General - Inquiry >> Feb 07, 2021  3:14 PM Greggory Keen D wrote: Reason for CRM: Pt called saying he go a text today saying his sleep results were ready.  CB#  [phone]

## 2021-02-26 NOTE — Telephone Encounter (Signed)
Please notify patient that I have reviewed his sleep study results.  Sleep study results received by fax from [facility]. The sleepy study tests on 5/13 and 5/14 do not demonstrate sleep apnea and do not show any low oxygen desaturation overnight.  I believe the sleep center may have shared his results with him, or maybe not.  I am not planning to order any other sleep study for him at this time, however it does look like they may be able to offer an "in lab" sleep study in future, I am not sure how this works if it is covered or not. That would be up to him and the sleep center.  Nobie Putnam, [facility] 02/26/2021, 6:00 PM

## 2021-03-10 NOTE — Telephone Encounter (Signed)
Pt is calling to ask why he has not heard back regarding sleep study results. Cb- [phone]

## 2021-03-10 NOTE — Telephone Encounter (Signed)
Patient is aware of results. There was confusion with the sleep center and his mychart. No further needs at this time.

## 2021-03-28 ENCOUNTER — Telehealth: Payer: Self-pay | Admitting: Family Medicine

## 2021-03-28 NOTE — Telephone Encounter (Signed)
See previous telephone calls in 01/2021  There was confusion on which sleep center tests were ordered at but he eventually completed [facility] home sleep study and repeat test on 5/13 and 5/14. It did not demonstrate sleep apnea.  Now it looks like we received a fax order from [facility] for new sleep study orders.  Can you contact patient and clarify what exactly he needs?  I am not sure if he is requesting this new sleep study order or if [facility] is requesting order from a previous incorrect request that was sent to them  Also not sure if it will be covered since he just did it in 01/2021  Nobie Putnam, [facility] 03/28/2021, 10:47 AM

## 2021-03-28 NOTE — Telephone Encounter (Signed)
I called and left a message asking him to call for clarification.

## 2021-07-10 ENCOUNTER — Other Ambulatory Visit: Payer: Self-pay | Admitting: Family Medicine

## 2021-07-10 MED ORDER — SUMATRIPTAN 20 MG/ACT NA SOLN: 20.0000 mg | NASAL | 2 refills | Status: DC | PRN

## 2021-08-20 ENCOUNTER — Other Ambulatory Visit: Payer: Self-pay

## 2021-08-20 ENCOUNTER — Ambulatory Visit (INDEPENDENT_AMBULATORY_CARE_PROVIDER_SITE_OTHER): Payer: PRIVATE HEALTH INSURANCE | Admitting: Dermatology

## 2021-08-20 ENCOUNTER — Encounter: Payer: Self-pay | Admitting: Dermatology

## 2021-08-20 DIAGNOSIS — L578 Other skin changes due to chronic exposure to nonionizing radiation: Secondary | ICD-10-CM

## 2021-08-20 DIAGNOSIS — L821 Other seborrheic keratosis: Secondary | ICD-10-CM

## 2021-08-20 DIAGNOSIS — Z1283 Encounter for screening for malignant neoplasm of skin: Secondary | ICD-10-CM | POA: Diagnosis not present

## 2021-08-20 DIAGNOSIS — D1800 Hemangioma unspecified site: Secondary | ICD-10-CM

## 2021-08-20 DIAGNOSIS — D229 Melanocytic nevi, unspecified: Secondary | ICD-10-CM

## 2021-08-20 DIAGNOSIS — D2272 Melanocytic nevi of left lower limb, including hip: Secondary | ICD-10-CM | POA: Diagnosis not present

## 2021-08-20 DIAGNOSIS — B079 Viral wart, unspecified: Secondary | ICD-10-CM

## 2021-08-20 DIAGNOSIS — D2271 Melanocytic nevi of right lower limb, including hip: Secondary | ICD-10-CM | POA: Diagnosis not present

## 2021-08-20 DIAGNOSIS — D2371 Other benign neoplasm of skin of right lower limb, including hip: Secondary | ICD-10-CM

## 2021-08-20 DIAGNOSIS — L814 Other melanin hyperpigmentation: Secondary | ICD-10-CM

## 2021-08-20 NOTE — Progress Notes (Addendum)
New Patient Visit  Subjective  Ethan Glenn is a 60 y.o. male who presents for the following: New Patient (Initial Visit) (Patient here today as a new patient concerning a dark mole at right axilla he would like checked. Patient denies personal or family history of skin cancer. He reports a history of having a cyst removed at back. ).  The following portions of the chart were reviewed this encounter and updated as appropriate:   Tobacco  Allergies  Meds  Problems  Med Hx  Surg Hx  Fam Hx      Review of Systems:  No other skin or systemic complaints except as noted in HPI or Assessment and Plan.  Objective  Well appearing patient in no apparent distress; mood and affect are within normal limits.  A full examination was performed including scalp, head, eyes, ears, nose, lips, neck, chest, axillae, abdomen, back, buttocks, bilateral upper extremities, bilateral lower extremities, hands, feet, fingers, toes, fingernails, and toenails. All findings within normal limits unless otherwise noted below.  3 at right plantar foot, 2 at left plantar foot Verrucous papules -- Discussed viral etiology and contagion.   Left Middle Plantar Surface, Right Ankle - Posterior 0.3 cm medium brown macule without features suspicious for malignancy on dermoscopy at left plantar foot 0.2 cm dark brown very thin papule without features suspicious for malignancy on dermoscopy at right posterior ankle    Assessment & Plan  Viral warts, unspecified type 3 at right plantar foot, 2 at left plantar foot  Discussed viral etiology and risk of spread.  Discussed multiple treatments may be required to clear warts.  Discussed possible post-treatment dyspigmentation and risk of recurrence.  Pt prefers topical at home  Start prescription skin medicinals 5FU/salicylic acid paste nightly under occlusion Reviewed risk of irritation, inflammation. If irritated, d/c for a few days until calm, then restart using less  of the paste only to right at the wart    Nevus (2) Right Ankle - Posterior; Left Middle Plantar Surface  Benign-appearing.  Observation.  Call clinic for new or changing lesions.  Recommend daily use of broad spectrum spf 30+ sunscreen to sun-exposed areas.   Lentigines - Scattered tan macules - Due to sun exposure - Benign-appearing, observe - Recommend daily broad spectrum sunscreen SPF 30+ to sun-exposed areas, reapply every 2 hours as needed. - Call for any changes  Seborrheic Keratoses - Stuck-on, waxy, tan-brown papules and/or plaques  - Benign-appearing - Discussed benign etiology and prognosis. - Observe - Call for any changes  Melanocytic Nevi - Tan-brown and/or pink-flesh-colored symmetric macules and papules at right axilla  - Benign appearing on exam today - Observation - Call clinic for new or changing moles - Recommend daily use of broad spectrum spf 30+ sunscreen to sun-exposed areas.   Hemangiomas - Red papules - Discussed benign nature - Observe - Call for any changes  Dermatofibroma - Firm pink/brown papulenodule with dimple sign right anterior thigh - Benign appearing - Call for any changes  Actinic Damage - Chronic condition, secondary to cumulative UV/sun exposure - diffuse scaly erythematous macules with underlying dyspigmentation - Recommend daily broad spectrum sunscreen SPF 30+ to sun-exposed areas, reapply every 2 hours as needed.  - Staying in the shade or wearing long sleeves, sun glasses (UVA+UVB protection) and wide brim hats (4-inch brim around the entire circumference of the hat) are also recommended for sun protection.  - Call for new or changing lesions.  Skin cancer screening performed today.  Return  for 1 - 2  year tbse .  I, Ruthell Rummage, CMA, am acting as scribe for Forest Gleason, MD.  Documentation: I have reviewed the above documentation for accuracy and completeness, and I agree with the above.  Forest Gleason, MD

## 2021-08-20 NOTE — Patient Instructions (Addendum)
Recommend taking Heliocare sun protection supplement daily in sunny weather for additional sun protection. For maximum protection on the sunniest days, you can take up to 2 capsules of regular Heliocare OR take 1 capsule of Heliocare Ultra. For prolonged exposure (such as a full day in the sun), you can repeat your dose of the supplement 4 hours after your first dose. Heliocare can be purchased at [facility] or at [email or web address].    Instructions for [facility] Medications  One or more of your medications was sent to the [facility] mail order compounding pharmacy. You will receive an email from them and can purchase the medicine through that link. It will then be mailed to your home at the address you confirmed. If for any reason you do not receive an email from them, please check your spam folder. If you still do not find the email, please let us know. [facility] phone number is [phone].     Seborrheic Keratosis  What causes seborrheic keratoses? Seborrheic keratoses are harmless, common skin growths that first appear during adult life.  As time goes by, more growths appear.  Some people may develop a large number of them.  Seborrheic keratoses appear on both covered and uncovered body parts.  They are not caused by sunlight.  The tendency to develop seborrheic keratoses can be inherited.  They vary in color from skin-colored to gray, brown, or even black.  They can be either smooth or have a rough, warty surface.   Seborrheic keratoses are superficial and look as if they were stuck on the skin.  Under the microscope this type of keratosis looks like layers upon layers of skin.  That is why at times the top layer may seem to fall off, but the rest of the growth remains and re-grows.    Treatment Seborrheic keratoses do not need to be treated, but can easily be removed in the office.  Seborrheic keratoses often cause symptoms when they rub on clothing or jewelry.   Lesions can be in the way of shaving.  If they become inflamed, they can cause itching, soreness, or burning.  Removal of a seborrheic keratosis can be accomplished by freezing, burning, or surgery. If any spot bleeds, scabs, or grows rapidly, please return to have it checked, as these can be an indication of a skin cancer.   Melanoma ABCDEs  Melanoma is the most dangerous type of skin cancer, and is the leading cause of death from skin disease.  You are more likely to develop melanoma if you: Have light-colored skin, light-colored eyes, or red or blond hair Spend a lot of time in the sun Tan regularly, either outdoors or in a tanning bed Have had blistering sunburns, especially during childhood Have a close family member who has had a melanoma Have atypical moles or large birthmarks  Early detection of melanoma is key since treatment is typically straightforward and cure rates are extremely high if we catch it early.   The first sign of melanoma is often a change in a mole or a new dark spot.  The ABCDE system is a way of remembering the signs of melanoma.  A for asymmetry:  The two halves do not match. B for border:  The edges of the growth are irregular. C for color:  A mixture of colors are present instead of an even brown color. D for diameter:  Melanomas are usually (but not always) greater than 64mm - the size of a pencil  eraser. E for evolution:  The spot keeps changing in size, shape, and color.  Please check your skin once per month between visits. You can use a small mirror in front and a large mirror behind you to keep an eye on the back side or your body.   If you see any new or changing lesions before your next follow-up, please call to schedule a visit.  Please continue daily skin protection including broad spectrum sunscreen SPF 30+ to sun-exposed areas, reapplying every 2 hours as needed when you're outdoors.   Staying in the shade or wearing long sleeves, sun glasses  (UVA+UVB protection) and wide brim hats (4-inch brim around the entire circumference of the hat) are also recommended for sun protection.    If You Need Anything After Your Visit  If you have any questions or concerns for your doctor, please call our main line at [phone] and press option 4 to reach your doctor's medical assistant. If no one answers, please leave a voicemail as directed and we will return your call as soon as possible. Messages left after 4 pm will be answered the following business day.   You may also send [place] a message via [place]. We typically respond to MyChart messages within 1-2 business days.  For prescription refills, please ask your pharmacy to contact our office. Our fax number is [phone].  If you have an urgent issue when the clinic is closed that cannot wait until the next business day, you can page your doctor at the number below.    Please note that while we do our best to be available for urgent issues outside of office hours, we are not available 24/7.   If you have an urgent issue and are unable to reach [place], you may choose to seek medical care at your doctor's office, retail clinic, urgent care center, or emergency room.  If you have a medical emergency, please immediately call 911 or go to the emergency department.  Pager Numbers  - Dr. Nehemiah Massed: [phone]  - Dr. Laurence Ferrari: [phone]  - Dr. Nicole Kindred: [phone]  In the event of inclement weather, please call our main line at [phone] for an update on the status of any delays or closures.  Dermatology Medication Tips: Please keep the boxes that topical medications come in in order to help keep track of the instructions about where and how to use these. Pharmacies typically print the medication instructions only on the boxes and not directly on the medication tubes.   If your medication is too expensive, please contact our office at [phone] option 4 or send [place] a message through  [place].   We are unable to tell what your co-pay for medications will be in advance as this is different depending on your insurance coverage. However, we may be able to find a substitute medication at lower cost or fill out paperwork to get insurance to cover a needed medication.   If a prior authorization is required to get your medication covered by your insurance company, please allow [place] 1-2 business days to complete this process.  Drug prices often vary depending on where the prescription is filled and some pharmacies may offer cheaper prices.  The website www.goodrx.com contains coupons for medications through different pharmacies. The prices here do not account for what the cost may be with help from insurance (it may be cheaper with your insurance), but the website can give you the price if you did not use any insurance.  -  You can print the associated coupon and take it with your prescription to the pharmacy.  - You may also stop by our office during regular business hours and pick up a GoodRx coupon card.  - If you need your prescription sent electronically to a different pharmacy, notify our office through [facility] or by phone at [phone] option 4.     Si Usted Necesita Algo Despus de Su Visita  Tambin puede enviarnos un mensaje a travs de Pharmacist, community. Por lo general respondemos a los mensajes de MyChart en el transcurso de 1 a 2 das hbiles.  Para renovar recetas, por favor pida a su farmacia que se ponga en contacto con nuestra oficina. Harland Dingwall de fax es [place] [phone].  Si tiene un asunto urgente cuando la clnica est cerrada y que no puede esperar hasta el siguiente da hbil, puede llamar/localizar a su doctor(a) al nmero que aparece a continuacin.   Por favor, tenga en cuenta que aunque hacemos todo lo posible para estar disponibles para asuntos urgentes fuera del horario de [place], no estamos disponibles las 24 horas del da, los 7 das de la  Between.   Si tiene un problema urgente y no puede comunicarse con nosotros, puede optar por buscar atencin mdica  en el consultorio de su doctor(a), en una clnica privada, en un centro de atencin urgente o en una sala de emergencias.  Si tiene Engineering geologist, por favor llame inmediatamente al 911 o vaya a la sala de emergencias.  Nmeros de bper  - Dr. Nehemiah Massed: [phone]  - Dra. Moye: [phone]  - Dra. Nicole Kindred: [phone]  En caso de inclemencias del [place], por favor llame a Johnsie Kindred principal al [phone] para una actualizacin sobre el [place] de cualquier retraso o cierre.  Consejos para la medicacin en dermatologa: Por favor, guarde las cajas en las que vienen los medicamentos de uso tpico para ayudarle a seguir las instrucciones sobre dnde y cmo usarlos. Las farmacias generalmente imprimen las instrucciones del medicamento slo en las cajas y no directamente en los tubos del [place].   Si su medicamento es muy caro, por favor, pngase en contacto con Zigmund Daniel llamando al [phone] y presione la opcin 4 o envenos un mensaje a travs de Pharmacist, community.   No podemos decirle cul ser su copago por los medicamentos por adelantado ya que esto es diferente dependiendo de la cobertura de su seguro. Sin embargo, es posible que podamos encontrar un medicamento sustituto a Electrical engineer un formulario para que el seguro cubra el medicamento que se considera necesario.   Si se requiere una autorizacin previa para que su compaa de seguros [place] su medicamento, por favor permtanos de 1 a 2 das hbiles para completar este proceso.  Los precios de los medicamentos varan con frecuencia dependiendo del Environmental consultant de dnde se surte la receta y alguna farmacias pueden ofrecer precios ms baratos.  El sitio web www.goodrx.com tiene cupones para medicamentos de Airline pilot. Los precios aqu no tienen en cuenta lo que podra costar con la ayuda del  seguro (puede ser ms barato con su seguro), pero el sitio web puede darle el precio si no utiliz Research scientist (physical sciences).  - Puede imprimir el cupn correspondiente y llevarlo con su receta a la farmacia.  - Tambin puede pasar por nuestra oficina durante el horario de atencin regular y Charity fundraiser una tarjeta de cupones de GoodRx.  - Si necesita que su receta se enve electrnicamente a una farmacia diferente, informe  a nuestra oficina a travs de MyChart de [place] o por telfono llamando al [phone] y presione la opcin 4.

## 2021-08-28 ENCOUNTER — Encounter: Payer: Self-pay | Admitting: Family Medicine

## 2021-08-28 ENCOUNTER — Ambulatory Visit: Payer: 59 | Admitting: Family Medicine

## 2021-08-28 ENCOUNTER — Other Ambulatory Visit: Payer: Self-pay

## 2021-08-28 VITALS — BP 110/74 | HR 73 | Ht 74.0 in | Wt 195.2 lb

## 2021-08-28 DIAGNOSIS — Z8249 Family history of ischemic heart disease and other diseases of the circulatory system: Secondary | ICD-10-CM

## 2021-08-28 DIAGNOSIS — R7309 Other abnormal glucose: Secondary | ICD-10-CM

## 2021-08-28 DIAGNOSIS — F419 Anxiety disorder, unspecified: Secondary | ICD-10-CM | POA: Diagnosis not present

## 2021-08-28 DIAGNOSIS — E7800 Pure hypercholesterolemia, unspecified: Secondary | ICD-10-CM

## 2021-08-28 DIAGNOSIS — I7121 Aneurysm of the ascending aorta, without rupture: Secondary | ICD-10-CM

## 2021-08-28 DIAGNOSIS — N401 Enlarged prostate with lower urinary tract symptoms: Secondary | ICD-10-CM

## 2021-08-28 DIAGNOSIS — R351 Nocturia: Secondary | ICD-10-CM

## 2021-08-28 MED ORDER — TAMSULOSIN HCL 0.4 MG PO CAPS: 0.4000 mg | ORAL_CAPSULE | Freq: Every day | ORAL | 3 refills | Status: DC

## 2021-08-28 NOTE — Progress Notes (Signed)
Subjective:    Patient ID: Ethan Glenn, male    DOB: [birth year], 60 y.o.   MRN: [id number]  Ethan Glenn is a 60 y.o. male presenting on 08/28/2021 for heart concerns and Hyperlipidemia   HPI  Family history Cardiovascular Disease Fam hx Brother 67 years older, age 38, was just recently diagnosed with a mild heart attack. Sister 68 year older, she has had some prior heart problems. She went to Cardiologist and advised 99% risk of heart attack. Of 6 siblings, he has had done well with both his genetic history and lifestyle. Significant family history with his father and his paternal grandfather with heart attack in past.  Last physical / lab review 07/2020  Lifestyle He has lost some weight, now 195 lbs, prior 205+ lbs. He has remained active physically  HYPERLIPIDEMIA: - Reports concerns as listed above. Last lipid panel 07/2020, he had elevated LDL 116 previously Not on Statin therapy He has overhauled diet and limited fried foods.  Anxiety Previously working full time as Pharmacist, hospital, he was having more stress and anxiety He has had several major life stressors since he was placed on Lexapro 5mg  in past, he  Working part time, and started [employer] business  Hx Hand Arthritis S/p Left sided trigger finger release.  Nocturia Stopped drinking water around 7pm, was drinking a lot during day, 16 oz glasses, often go to bed at 11pm still wakes up. Waking up to urinate 2-3 times Hx of nocturnal enuresis  AUA BPH Symptom Score over past 1 month 1. Sensation of not emptying bladder post void - 0 2. Urinate less than 2 hour after finish last void - 3 3. Start/Stop several times during void - 3 4. Difficult to postpone urination - 0 5. Weak urinary stream - 3 6. Push or strain urination - 2 7. Nocturia - 2-3 times  Total Score: 13-14 (Moderate BPH symptoms)   Depression screen [facility] 2/9 08/28/2021 01/17/2021  Decreased Interest 0 0  Down, Depressed, Hopeless 0 0  PHQ - 2 Score 0  0  Altered sleeping 2 1  Tired, decreased energy 1 1  Change in appetite 0 0  Feeling bad or failure about yourself  0 0  Trouble concentrating 0 0  Moving slowly or fidgety/restless 0 0  Suicidal thoughts 0 1  PHQ-9 Score 3 3  Difficult doing work/chores Somewhat difficult -   GAD 7 : Generalized Anxiety Score 08/28/2021 01/17/2021  Nervous, Anxious, on Edge 0 0  Control/stop worrying 0 0  Worry too much - different things 0 1  Trouble relaxing 0 1  Restless 0 0  Easily annoyed or irritable 0 0  Afraid - awful might happen 0 0  Total GAD 7 Score 0 2  Anxiety Difficulty Not difficult at all -    Social History   Tobacco Use   Smoking status: Former    Types: Cigarettes    Quit date: 08/07/1986    Years since quitting: 35.0   Smokeless tobacco: Never  Vaping Use   Vaping Use: Never used  Substance Use Topics   Alcohol use: Yes    Alcohol/week: 3.0 standard drinks    Types: 3 Standard drinks or equivalent per week   Drug use: Never    Review of Systems Per HPI unless specifically indicated above     Objective:    BP 110/74   Pulse 73   Ht 6\' 2"  (1.88 m)   Wt 195 lb 3.2 oz (88.5 kg)  SpO2 100%   BMI 25.06 kg/m   Wt Readings from Last 3 Encounters:  08/28/21 195 lb 3.2 oz (88.5 kg)  01/17/21 206 lb (93.4 kg)  11/16/20 205 lb (93 kg)    Physical Exam Vitals and nursing note reviewed.  Constitutional:      General: He is not in acute distress.    Appearance: Normal appearance. He is well-developed. He is not diaphoretic.     Comments: Well-appearing, comfortable, cooperative  HENT:     Head: Normocephalic and atraumatic.  Eyes:     General:        Right eye: No discharge.        Left eye: No discharge.     Conjunctiva/sclera: Conjunctivae normal.  Cardiovascular:     Rate and Rhythm: Normal rate.  Pulmonary:     Effort: Pulmonary effort is normal.  Skin:    General: Skin is warm and dry.     Findings: No erythema or rash.  Neurological:      Mental Status: He is alert and oriented to person, place, and time.  Psychiatric:        Mood and Affect: Mood normal.        Behavior: Behavior normal.        Thought Content: Thought content normal.     Comments: Well groomed, good eye contact, normal speech and thoughts   Results for orders placed or performed in visit on 08/07/20  CBC with Differential  Result Value Ref Range   WBC 5.3 3.8 - 10.8 Thousand/uL   RBC 5.13 4.20 - 5.80 Million/uL   Hemoglobin 14.8 13.2 - 17.1 g/dL   HCT 45.6 38.5 - 50.0 %   MCV 88.9 80.0 - 100.0 fL   MCH 28.8 27.0 - 33.0 pg   MCHC 32.5 32.0 - 36.0 g/dL   RDW 12.4 11.0 - 15.0 %   Platelets 357 140 - 400 Thousand/uL   MPV 11.2 7.5 - 12.5 fL   Neutro Abs 3,281 1,500 - 7,800 cells/uL   Lymphs Abs 1,399 850 - 3,900 cells/uL   Absolute Monocytes 488 200 - 950 cells/uL   Eosinophils Absolute 90 15 - 500 cells/uL   Basophils Absolute 42 0 - 200 cells/uL   Neutrophils Relative % 61.9 %   Total Lymphocyte 26.4 %   Monocytes Relative 9.2 %   Eosinophils Relative 1.7 %   Basophils Relative 0.8 %  COMPLETE METABOLIC PANEL WITH GFR  Result Value Ref Range   Glucose, Bld 113 (H) 65 - 99 mg/dL   BUN 12 7 - 25 mg/dL   Creat 1.06 0.70 - 1.33 mg/dL   GFR, Est Non African American 78 > OR = 60 mL/min/1.33m2   GFR, Est African American 90 > OR = 60 mL/min/1.74m2   BUN/Creatinine Ratio NOT APPLICABLE 6 - 22 (calc)   Sodium 138 135 - 146 mmol/L   Potassium 4.7 3.5 - 5.3 mmol/L   Chloride 104 98 - 110 mmol/L   CO2 25 20 - 32 mmol/L   Calcium 10.5 (H) 8.6 - 10.3 mg/dL   Total Protein 6.8 6.1 - 8.1 g/dL   Albumin 4.3 3.6 - 5.1 g/dL   Globulin 2.5 1.9 - 3.7 g/dL (calc)   AG Ratio 1.7 1.0 - 2.5 (calc)   Total Bilirubin 1.2 0.2 - 1.2 mg/dL   Alkaline phosphatase (APISO) 79 35 - 144 U/L   AST 20 10 - 35 U/L   ALT 27 9 - 46 U/L  Lipid Profile  Result Value Ref Range   Cholesterol 200 (H) <200 mg/dL   HDL 62 > OR = 40 mg/dL   Triglycerides 112 <150 mg/dL   LDL  Cholesterol (Calc) 116 (H) mg/dL (calc)   Total CHOL/HDL Ratio 3.2 <5.0 (calc)   Non-HDL Cholesterol (Calc) 138 (H) <130 mg/dL (calc)  TSH + free T4  Result Value Ref Range   TSH W/REFLEX TO FT4 1.94 0.40 - 4.50 mIU/L  Measles/Mumps/Rubella Immunity  Result Value Ref Range   Rubeola IgG 73.00 AU/mL   Mumps IgG 258.00 AU/mL   Rubella 23.20 Index  Hepatitis B core antibody, total  Result Value Ref Range   Hep B Core Total Ab NON-REACTIVE NON-REACTI  Hepatitis B surface antibody,qualitative  Result Value Ref Range   Hep B S Ab REACTIVE (A) NON-REACTI  TEST AUTHORIZATION  Result Value Ref Range   TEST NAME: HEPATITIS B SURFACE    TEST CODE: [id number]    CLIENT CONTACT: KELITA RUSSELL    REPORT ALWAYS MESSAGE SIGNATURE    PPD  Result Value Ref Range   TB Skin Test Negative    Induration 0 mm      Assessment & Plan:   Problem List Items Addressed This Visit     Pure hypercholesterolemia   Relevant Orders   Lipid panel   COMPLETE METABOLIC PANEL WITH GFR   Family history of cardiovascular disease   Relevant Orders   Lipid panel   CT CARDIAC SCORING (SELF PAY ONLY)   Benign prostatic hyperplasia with nocturia - Primary   Relevant Medications   tamsulosin (FLOMAX) 0.4 MG CAPS capsule   Other Relevant Orders   PSA   Anxiety   Other Visit Diagnoses     Abnormal glucose       Relevant Orders   Hemoglobin A1c       #Anxiety Improved now with retirement Phase down on dose of Escitalopram 20 down to 10mg , cut tabs in half for 10mg  daily, notify office / mychart if need new rx for 10mg  vs 5mg  in future daily maintenance, may come off if prefer  #BPH Consistent clinically with new diagnosis BPH, without lower urinary tract symptoms (LUTS) or any evidence of obstruction. - AUA BPH score 14 (moderate) Never on med No documented last PSA recently - No known personal/family history of prostate CA  Plan: 1. Start Tamsulosin 0.4mg  daily 2. Follow-up as needed  #Fam  history Cardiovascular disease Reviewed risk factors / family history Ultimately he is modifying his risk factors and improving lifestyle, cholesterol Will check lab panel next week The 10-year ASCVD risk score (Arnett DK, et al., 2019) is: 4.8% Not indicated for statin rx therapy at this time, re-calc with labs Discussed that he is asymptomatic, and next option to pursue screening would be CT Coronary Imaging - done for self pay $99 at imaging center, order in to be scheduled. If abnormal CT or other concerns identified or new issues - will refer to Cardiology for more detailed eval and stress testing.  Orders Placed This Encounter  Procedures   CT CARDIAC SCORING (SELF PAY ONLY)    Standing Status:   Future    Standing Expiration Date:   08/28/2022    Order Specific Question:   Preferred imaging location?    Answer:   [facility]   PSA    Standing Status:   Future    Standing Expiration Date:   08/28/2022   Lipid panel    Standing Status:   Future  Standing Expiration Date:   08/28/2022    Order Specific Question:   Has the patient fasted?    Answer:   Yes   Hemoglobin A1c    Standing Status:   Future    Standing Expiration Date:   08/28/2022   COMPLETE METABOLIC PANEL WITH GFR    Standing Status:   Future    Standing Expiration Date:   08/28/2022     Meds ordered this encounter  Medications   tamsulosin (FLOMAX) 0.4 MG CAPS capsule    Sig: Take 1 capsule (0.4 mg total) by mouth daily after supper.    Dispense:  90 capsule    Refill:  3    Follow up plan: Return if symptoms worsen or fail to improve, for 1 week fasting lab only.  Future labs ordered for within 1 week, fasting labs.   Nobie Putnam, [facility] 08/28/2021, 10:51 AM

## 2021-08-28 NOTE — Patient Instructions (Addendum)
Thank you for coming to the office today.  Medication Taper Off Instructions Current med - Escitalopram 20mg    Week 1-2: Cut tablet in half for Escitalopram 10mg  daily - let me know if this is working well, send a [employer] I can re order the 10mg  Escitalopram for you, and we can continue this longer term. Maybe down to the 5mg  in future if needed  Coronary Calcium CT Scan ordered - at [facility] - they will call to schedule you  If it shows any issue and if labs are abnormal. We can refer to Cardiology for further consultation - may need future stress test.   DUE for FASTING BLOOD WORK (no food or drink after midnight before the lab appointment, only water or coffee without cream/sugar on the morning of)  SCHEDULE "Lab Only" visit in the morning at the clinic for lab draw in 1-2 WEEKS  - Make sure Lab Only appointment is at about 1 week before your next appointment, so that results will be available  For Lab Results, once available within 2-3 days of blood draw, you can can log in to MyChart online to view your results and a brief explanation. Also, we can discuss results at next follow-up visit.   Please schedule a Follow-up Appointment to: Return if symptoms worsen or fail to improve, for 1 week fasting lab only.  If you have any other questions or concerns, please feel free to call the office or send a message through [place]. You may also schedule an earlier appointment if necessary.  Additionally, you may be receiving a survey about your experience at our office within a few days to 1 week by e-mail or mail. We value your feedback.  Nobie Putnam, DO [place]

## 2021-08-29 ENCOUNTER — Other Ambulatory Visit: Payer: Self-pay

## 2021-08-29 DIAGNOSIS — Z8249 Family history of ischemic heart disease and other diseases of the circulatory system: Secondary | ICD-10-CM

## 2021-08-29 DIAGNOSIS — R351 Nocturia: Secondary | ICD-10-CM

## 2021-08-29 DIAGNOSIS — E7800 Pure hypercholesterolemia, unspecified: Secondary | ICD-10-CM

## 2021-08-29 DIAGNOSIS — R7309 Other abnormal glucose: Secondary | ICD-10-CM

## 2021-08-29 DIAGNOSIS — N401 Enlarged prostate with lower urinary tract symptoms: Secondary | ICD-10-CM

## 2021-09-01 ENCOUNTER — Other Ambulatory Visit: Payer: 59

## 2021-09-02 ENCOUNTER — Ambulatory Visit: Payer: 59 | Admitting: Family Medicine

## 2021-09-02 LAB — COMPLETE METABOLIC PANEL WITH GFR
AG Ratio: 1.8 (calc) (ref 1.0–2.5)
ALT: 16 U/L (ref 9–46)
AST: 18 U/L (ref 10–35)
Albumin: 4.2 g/dL (ref 3.6–5.1)
Alkaline phosphatase (APISO): 71 U/L (ref 35–144)
BUN: 18 mg/dL (ref 7–25)
CO2: 25 mmol/L (ref 20–32)
Calcium: 9.9 mg/dL (ref 8.6–10.3)
Chloride: 105 mmol/L (ref 98–110)
Creat: 1.06 mg/dL (ref 0.70–1.30)
Globulin: 2.4 g/dL (calc) (ref 1.9–3.7)
Glucose, Bld: 86 mg/dL (ref 65–99)
Potassium: 4.2 mmol/L (ref 3.5–5.3)
Sodium: 138 mmol/L (ref 135–146)
Total Bilirubin: 1.1 mg/dL (ref 0.2–1.2)
Total Protein: 6.6 g/dL (ref 6.1–8.1)
eGFR: 81 mL/min/{1.73_m2} (ref >=60)

## 2021-09-02 LAB — PSA: PSA: 1.05 ng/mL (ref <=4.00)

## 2021-09-02 LAB — HEMOGLOBIN A1C
Hgb A1c MFr Bld: 5.2 % of total Hgb (ref <5.7)
Mean Plasma Glucose: 103 mg/dL
eAG (mmol/L): 5.7 mmol/L

## 2021-09-02 LAB — LIPID PANEL
Cholesterol: 195 mg/dL (ref <200)
HDL: 61 mg/dL (ref >=40)
LDL Cholesterol (Calc): 118 mg/dL (calc) — ABNORMAL HIGH
Non-HDL Cholesterol (Calc): 134 mg/dL (calc) — ABNORMAL HIGH (ref <130)
Total CHOL/HDL Ratio: 3.2 (calc) (ref <5.0)
Triglycerides: 64 mg/dL (ref <150)

## 2021-09-03 ENCOUNTER — Encounter: Payer: Self-pay | Admitting: Family Medicine

## 2021-09-03 ENCOUNTER — Other Ambulatory Visit: Payer: Self-pay

## 2021-09-03 ENCOUNTER — Ambulatory Visit
Admission: RE | Admit: 2021-09-03 | Discharge: 2021-09-03 | Disposition: A | Discharge status: Home / Self Care | Payer: PRIVATE HEALTH INSURANCE | Source: Ambulatory Visit | Attending: Family Medicine | Admitting: Family Medicine

## 2021-09-03 DIAGNOSIS — Z8249 Family history of ischemic heart disease and other diseases of the circulatory system: Secondary | ICD-10-CM | POA: Insufficient documentation

## 2021-09-03 DIAGNOSIS — I7121 Aneurysm of the ascending aorta, without rupture: Secondary | ICD-10-CM | POA: Insufficient documentation

## 2021-09-03 NOTE — Addendum Note (Signed)
Addended by: Olin Hauser on: 09/03/2021 05:50 PM   Modules accepted: Orders

## 2021-09-08 ENCOUNTER — Ambulatory Visit (INDEPENDENT_AMBULATORY_CARE_PROVIDER_SITE_OTHER): Payer: PRIVATE HEALTH INSURANCE | Admitting: Vascular Surgery

## 2021-09-08 ENCOUNTER — Other Ambulatory Visit: Payer: Self-pay

## 2021-09-08 ENCOUNTER — Encounter (INDEPENDENT_AMBULATORY_CARE_PROVIDER_SITE_OTHER): Payer: Self-pay | Admitting: Vascular Surgery

## 2021-09-08 VITALS — BP 117/72 | HR 88 | Resp 16 | Wt 194.6 lb

## 2021-09-08 DIAGNOSIS — E7800 Pure hypercholesterolemia, unspecified: Secondary | ICD-10-CM | POA: Diagnosis not present

## 2021-09-08 DIAGNOSIS — I7121 Aneurysm of the ascending aorta, without rupture: Secondary | ICD-10-CM

## 2021-09-08 NOTE — Progress Notes (Signed)
MRN : [id number]  Ethan Glenn is a 60 y.o. (July 24, 1961) male who presents with chief complaint of aneurysm.  History of Present Illness:   The patient presents to the office for evaluation of an abdominal aortic aneurysm. The aneurysm was found incidentally by CT scan performed for evaluating coronary calcification and the potential for atherosclerotic occlusive disease of the coronary arteries.. Patient denies chest pain, abdominal pain or unusual back pain, no other abdominal complaints.  No history of an acute onset of painful blue discoloration of the toes.     No family history of AAA.   Patient denies amaurosis fugax or TIA symptoms. There is no history of claudication or rest pain symptoms of the lower extremities.  The patient denies angina or shortness of breath.  CT scan shows an ascending TAA that measures 3.7 cm.  Visualized portions of the descending thoracic aorta are not aneurysmal at this time  No outpatient medications have been marked as taking for the 09/08/21 encounter (Appointment) with Delana Meyer, Dolores Lory, MD.    Past Medical History:  Diagnosis Date   ADHD    Allergic rhinitis    Anxiety    Frequent headaches    Migraine     Past Surgical History:  Procedure Laterality Date   [place]   KNEE SURGERY Left 1984    Social History Social History   Tobacco Use   Smoking status: Former    Types: Cigarettes    Quit date: 08/07/1986    Years since quitting: 35.1   Smokeless tobacco: Never  Vaping Use   Vaping Use: Never used  Substance Use Topics   Alcohol use: Yes    Alcohol/week: 3.0 standard drinks    Types: 3 Standard drinks or equivalent per week   Drug use: Never    Family History Family History  Problem Relation Age of Onset   Hypertension Father    Stroke Father     Allergies  Allergen Reactions   Erythromycin Nausea Only   Quinolones Other (See Comments)    Aortic Aneurysm - should avoid Fluoroquinolone medications.      REVIEW OF SYSTEMS (Negative unless checked)  Constitutional: [] Weight loss  [] Fever  [] Chills Cardiac: [] Chest pain   [] Chest pressure   [] Palpitations   [] Shortness of breath when laying flat   [] Shortness of breath with exertion. Vascular:  [] Pain in legs with walking   [] Pain in legs at rest  [] History of DVT   [] Phlebitis   [] Swelling in legs   [] Varicose veins   [] Non-healing ulcers Pulmonary:   [] Uses home oxygen   [] Productive cough   [] Hemoptysis   [] Wheeze  [] COPD   [] Asthma Neurologic:  [] Dizziness   [] Seizures   [] History of stroke   [] History of TIA  [] Aphasia   [] Vissual changes   [] Weakness or numbness in arm   [] Weakness or numbness in leg Musculoskeletal:   [] Joint swelling   [] Joint pain   [] Low back pain Hematologic:  [] Easy bruising  [] Easy bleeding   [] Hypercoagulable state   [] Anemic Gastrointestinal:  [] Diarrhea   [] Vomiting  [] Gastroesophageal reflux/heartburn   [] Difficulty swallowing. Genitourinary:  [] Chronic kidney disease   [] Difficult urination  [] Frequent urination   [] Blood in urine Skin:  [] Rashes   [] Ulcers  Psychological:  [] History of anxiety   []  History of major depression.  Physical Examination  There were no vitals filed for this visit. There is no height or weight on file to calculate BMI. Gen: WD/WN, NAD  Head: [place]/AT, No temporalis wasting.  Ear/Nose/Throat: Hearing grossly intact, nares w/o erythema or drainage Eyes: PER, EOMI, sclera nonicteric.  Neck: Supple, no masses.  No bruit or JVD.  Pulmonary:  Good air movement, no audible wheezing, no use of accessory muscles.  Cardiac: RRR, normal S1, S2, no Murmurs. Vascular:   No pulsatile abdominal mass noted Vessel Right Left  Radial Palpable Palpable  Carotid Palpable Palpable  Gastrointestinal: soft, non-distended. No guarding/no peritoneal signs.  Musculoskeletal: M/S 5/5 throughout.  No visible deformity.  Neurologic: CN 2-12 intact. Pain and light touch intact in extremities.   Symmetrical.  Speech is fluent. Motor exam as listed above. Psychiatric: Judgment intact, Mood & affect appropriate for pt's clinical situation. Dermatologic: No rashes or ulcers noted.  No changes consistent with cellulitis.   CBC Lab Results  Component Value Date   WBC 5.3 08/07/2020   HGB 14.8 08/07/2020   HCT 45.6 08/07/2020   MCV 88.9 08/07/2020   PLT 357 08/07/2020    BMET    Component Value Date/Time   NA 138 09/01/2021 0809   K 4.2 09/01/2021 0809   CL 105 09/01/2021 0809   CO2 25 09/01/2021 0809   GLUCOSE 86 09/01/2021 0809   BUN 18 09/01/2021 0809   CREATININE 1.06 09/01/2021 0809   CALCIUM 9.9 09/01/2021 0809   GFRNONAA 78 08/07/2020 1109   GFRAA 90 08/07/2020 1109   Estimated Creatinine Clearance: 88.3 mL/min (by C-G formula based on SCr of 1.06 mg/dL).  COAG No results found for: INR, PROTIME  Radiology CT CARDIAC SCORING (SELF PAY ONLY)  Addendum Date: 09/03/2021   ADDENDUM REPORT: 09/03/2021 19:39 CLINICAL DATA:  Risk stratification EXAM: Coronary Calcium Score TECHNIQUE: The patient was scanned on a Siemens Somatom go.Top Scanner. Axial non-contrast 3 mm slices were carried out through the heart. The data set was analyzed on a dedicated work station and scored using the [place]. FINDINGS: Non-cardiac: See separate report from [facility]. Ascending Aorta: Normal size Pericardium: Normal Coronary arteries: Normal origin of left and right coronary arteries. Distribution of arterial calcifications if present, as noted below; LM 0 LAD 0 LCx 0 RCA 0 Total 0 IMPRESSION AND RECOMMENDATION: 1. Normal coronary calcium score of 0. Patient is low risk for coronary events. 2.  CAC 0, CAC-DRS A0. 3.  Continue heart healthy lifestyle and risk factor modification. Kate Sable Electronically Signed   By: Kate Sable M.D.   On: 09/03/2021 19:39   Result Date: 09/03/2021 EXAM: OVER-READ INTERPRETATION  CT CHEST The following report is an over-read  performed by radiologist Dr. Rolm Baptise of [facility], [place] on 09/03/2021. This over-read does not include interpretation of cardiac or coronary anatomy or pathology. The coronary calcium score interpretation by the cardiologist is attached. COMPARISON:  None. FINDINGS: Vascular: Heart is normal size. Mild aneurysmal dilatation of the ascending thoracic aorta measuring 4 cm. Mediastinum/Nodes: Calcified mediastinal and hilar lymph nodes. No adenopathy. Lungs/Pleura: No confluent opacities or effusions. Upper Abdomen: Imaging into the upper abdomen demonstrates no acute findings. Musculoskeletal: Chest wall soft tissues are unremarkable. No acute bony abnormality. IMPRESSION: 4 cm ascending thoracic aortic aneurysm. Recommend annual imaging followup by CTA or MRA. This recommendation follows 2010 ACCF/AHA/AATS/ACR/ASA/SCA/SCAI/SIR/STS/SVM Guidelines for the Diagnosis and Management of Patients with Thoracic Aortic Disease. Circulation. 2010; 121: Q947-M546. Aortic aneurysm NOS (ICD10-I71.9) Old granulomatous disease. Electronically Signed: By: Rolm Baptise M.D. On: 09/03/2021 16:04     Assessment/Plan 1. Aneurysm of ascending aorta without rupture Recommend:  No surgery or  intervention at this time.  The patient has an asymptomatic thoracic aortic aneurysm that is less than 5.0 cm in maximal diameter.  I have discussed the natural history of thoracic aortic aneurysm and the small risk of rupture for aneurysm less than 6.0 cm in size.  However, as these small aneurysms tend to enlarge over time, continued surveillance with CT scan is mandatory.   I have also discussed optimizing medical management with hypertension and lipid control and the importance of abstinence from tobacco.  The patient is also encouraged to exercise a minimum of 30 minutes 4 times a week.   Should the patient develop new onset chest or back pain or signs of peripheral embolization they are instructed to seek medical  attention immediately and to alert the physician providing care that they have an aneurysm.   The patient voices their understanding.  The patient will return in 12 months with a CT scan of the chest  - CT Angio Chest W/Cm &/Or Wo Cm; Future  2. Pure hypercholesterolemia Continue statin as ordered and reviewed, no changes at this time     Hortencia Pilar, MD  09/08/2021 10:13 AM

## 2021-09-11 ENCOUNTER — Encounter (INDEPENDENT_AMBULATORY_CARE_PROVIDER_SITE_OTHER): Payer: Self-pay | Admitting: Vascular Surgery

## 2021-11-19 ENCOUNTER — Other Ambulatory Visit: Payer: Self-pay | Admitting: Family Medicine

## 2021-11-19 DIAGNOSIS — F419 Anxiety disorder, unspecified: Secondary | ICD-10-CM

## 2021-11-19 NOTE — Telephone Encounter (Signed)
Refilled 01/17/2021 #90 3 refills - 1 year supply.  ?Requested Prescriptions  ?Pending Prescriptions Disp Refills  ?? escitalopram (LEXAPRO) 20 MG tablet [Pharmacy Med Name: ESCITALOPRAM 20 MG TABLET] 90 tablet 3  ?  Sig: TAKE 1 TABLET BY MOUTH EVERY DAY  ?  ? Psychiatry:  Antidepressants - SSRI Passed - 11/19/2021  4:14 PM  ?  ?  Passed - Valid encounter within last 6 months  ?  Recent Outpatient Visits   ?      ? 2 months ago Benign prostatic hyperplasia with nocturia  ? [place], DO  ? 10 months ago Primary osteoarthritis of both hands  ? [place], DO  ? 1 year ago COVID-19 virus infection  ? [place], DO  ? 1 year ago Encounter to establish care  ? [facility], Lupita Raider, FNP  ?  ?  ?Future Appointments   ?        ? In 5 days Parks Ranger, Devonne Doughty, DO [facility], [place]  ? In 9 months Ralene Bathe, MD [place]  ?  ? ?  ?  ?  ? ?

## 2021-11-20 NOTE — Telephone Encounter (Signed)
Spoke with pharmacy. Pt picked up #[id number]  On 10/16/21 ?Requested Prescriptions  ?Pending Prescriptions Disp Refills  ?? escitalopram (LEXAPRO) 20 MG tablet [Pharmacy Med Name: ESCITALOPRAM 20 MG TABLET] 90 tablet 3  ?  Sig: TAKE 1 TABLET BY MOUTH EVERY DAY  ?  ? Psychiatry:  Antidepressants - SSRI Passed - 11/19/2021  7:27 PM  ?  ?  Passed - Valid encounter within last 6 months  ?  Recent Outpatient Visits   ?      ? 2 months ago Benign prostatic hyperplasia with nocturia  ? [place], DO  ? 10 months ago Primary osteoarthritis of both hands  ? [place], DO  ? 1 year ago COVID-19 virus infection  ? [place], DO  ? 1 year ago Encounter to establish care  ? [facility], Lupita Raider, FNP  ?  ?  ?Future Appointments   ?        ? In 4 days Parks Ranger, Devonne Doughty, DO [facility], [place]  ? In 9 months Ralene Bathe, MD [place]  ?  ? ?  ?  ?  ? ?

## 2021-11-24 ENCOUNTER — Other Ambulatory Visit: Payer: Self-pay

## 2021-11-24 ENCOUNTER — Ambulatory Visit: Payer: 59 | Admitting: Family Medicine

## 2021-11-24 ENCOUNTER — Encounter: Payer: Self-pay | Admitting: Family Medicine

## 2021-11-24 VITALS — BP 131/71 | HR 80 | Ht 74.0 in | Wt 194.6 lb

## 2021-11-24 DIAGNOSIS — F419 Anxiety disorder, unspecified: Secondary | ICD-10-CM

## 2021-11-24 DIAGNOSIS — N4340 Spermatocele of epididymis, unspecified: Secondary | ICD-10-CM

## 2021-11-24 DIAGNOSIS — N5089 Other specified disorders of the male genital organs: Secondary | ICD-10-CM

## 2021-11-24 NOTE — Progress Notes (Signed)
? ?Subjective:  ? ? Patient ID: Ethan Glenn, male    DOB: 05/14/1961, 60 y.o.   MRN: [id number] ? ?Ethan Glenn is a 60 y.o. male presenting on 11/24/2021 for Testicle Pain ? ? ?HPI ? ?Left Testicular Pain ?Reports sudden onset recent Left sided testicular pain, constant dull ache was moderate to fairly severe, worse with bending forward on Left would really aggravate pain and also pain with provoked and touching. He said if leaning back he would feel better. ? ?Additionally he noticed was having some pain with the Flomax and he stopped taking it, and the pain has resolved. ? ?He admits a hard cyst-like structure above Right testicle, questioning spermatocele. Non tender. ? ?PMH - He reports when late teenager he felt some extra lumps or bumps on left side only diagnosed with Varicocele at that time. He has never had problem. ? ? ?Anxiety ?Significant improvement on Lexapro currently at 10mg daily, down from 20mg, taking half tab. ? ?Past Surgical History:  ?Procedure Laterality Date  ? HERNIA REPAIR  1965  ? KNEE SURGERY Left 1984  ? ? ? ?Depression screen PHQ 2/9 11/24/2021 08/28/2021 01/17/2021  ?Decreased Interest 0 0 0  ?Down, Depressed, Hopeless 0 0 0  ?PHQ - 2 Score 0 0 0  ?Altered sleeping 2 2 1  ?Tired, decreased energy 1 1 1  ?Change in appetite 0 0 0  ?Feeling bad or failure about yourself  0 0 0  ?Trouble concentrating 0 0 0  ?Moving slowly or fidgety/restless 0 0 0  ?Suicidal thoughts 0 0 1  ?PHQ-9 Score 3 3 3  ?Difficult doing work/chores Not difficult at all Somewhat difficult -  ? ? ?Social History  ? ?Tobacco Use  ? Smoking status: Former  ?  Types: Cigarettes  ?  Quit date: 08/07/1986  ?  Years since quitting: 35.3  ? Smokeless tobacco: Never  ?Vaping Use  ? Vaping Use: Never used  ?Substance Use Topics  ? Alcohol use: Yes  ?  Alcohol/week: 3.0 standard drinks  ?  Types: 3 Standard drinks or equivalent per week  ? Drug use: Never  ? ? ?Review of Systems ?Per HPI unless specifically indicated  above ? ?   ?Objective:  ?  ?BP 131/71   Pulse 80   Ht 6' 2" (1.88 m)   Wt 194 lb 9.6 oz (88.3 kg)   SpO2 100%   BMI 24.99 kg/m?   ?Wt Readings from Last 3 Encounters:  ?11/24/21 194 lb 9.6 oz (88.3 kg)  ?09/08/21 194 lb 9.6 oz (88.3 kg)  ?08/28/21 195 lb 3.2 oz (88.5 kg)  ?  ?Physical Exam ?Vitals and nursing note reviewed.  ?Constitutional:   ?   General: He is not in acute distress. ?   Appearance: Normal appearance. He is well-developed. He is not diaphoretic.  ?   Comments: Well-appearing, comfortable, cooperative  ?HENT:  ?   Head: Normocephalic and atraumatic.  ?Eyes:  ?   General:     ?   Right eye: No discharge.     ?   Left eye: No discharge.  ?   Conjunctiva/sclera: Conjunctivae normal.  ?Cardiovascular:  ?   Rate and Rhythm: Normal rate.  ?Pulmonary:  ?   Effort: Pulmonary effort is normal.  ?Genitourinary: ?   Comments: External Genital Male Exam ?Palpable non inflamed and non tender moderate sized varicocele Left side. ? ?R side above testicle with large 1.5 mobile firm cystic mass palpated, in area suspect spermatocele. Non   tender. ? ?Testicles themselves are normal. ? ?Area of pain L side is superior into inguinal canal region. No palpable herniation provoked on exam ?Skin: ?   General: Skin is warm and dry.  ?   Findings: No erythema or rash.  ?Neurological:  ?   Mental Status: He is alert and oriented to person, place, and time.  ?Psychiatric:     ?   Mood and Affect: Mood normal.     ?   Behavior: Behavior normal.     ?   Thought Content: Thought content normal.  ?   Comments: Well groomed, good eye contact, normal speech and thoughts  ? ? ? ? ?Results for orders placed or performed in visit on 08/29/21  ?COMPLETE METABOLIC PANEL WITH GFR  ?Result Value Ref Range  ? Glucose, Bld 86 65 - 99 mg/dL  ? BUN 18 7 - 25 mg/dL  ? Creat 1.06 0.70 - 1.30 mg/dL  ? eGFR 81 > OR = 60 mL/min/1.73m2  ? BUN/Creatinine Ratio NOT APPLICABLE 6 - 22 (calc)  ? Sodium 138 135 - 146 mmol/L  ? Potassium 4.2 3.5 - 5.3  mmol/L  ? Chloride 105 98 - 110 mmol/L  ? CO2 25 20 - 32 mmol/L  ? Calcium 9.9 8.6 - 10.3 mg/dL  ? Total Protein 6.6 6.1 - 8.1 g/dL  ? Albumin 4.2 3.6 - 5.1 g/dL  ? Globulin 2.4 1.9 - 3.7 g/dL (calc)  ? AG Ratio 1.8 1.0 - 2.5 (calc)  ? Total Bilirubin 1.1 0.2 - 1.2 mg/dL  ? Alkaline phosphatase (APISO) 71 35 - 144 U/L  ? AST 18 10 - 35 U/L  ? ALT 16 9 - 46 U/L  ?Hemoglobin A1c  ?Result Value Ref Range  ? Hgb A1c MFr Bld 5.2 <5.7 % of total Hgb  ? Mean Plasma Glucose 103 mg/dL  ? eAG (mmol/L) 5.7 mmol/L  ?Lipid panel  ?Result Value Ref Range  ? Cholesterol 195 <200 mg/dL  ? HDL 61 > OR = 40 mg/dL  ? Triglycerides 64 <150 mg/dL  ? LDL Cholesterol (Calc) 118 (H) mg/dL (calc)  ? Total CHOL/HDL Ratio 3.2 <5.0 (calc)  ? Non-HDL Cholesterol (Calc) 134 (H) <130 mg/dL (calc)  ?PSA  ?Result Value Ref Range  ? PSA 1.05 < OR = 4.00 ng/mL  ? ?   ?Assessment & Plan:  ? ?Problem List Items Addressed This Visit   ? ? Anxiety  ? Relevant Medications  ? escitalopram (LEXAPRO) 10 MG tablet  ? ?Other Visit Diagnoses   ? ? Spermatic cord pain    -  Primary  ? Relevant Orders  ? US SCROTUM W/DOPPLER  ? Scrotal mass      ? Relevant Orders  ? US SCROTUM W/DOPPLER  ? Spermatocele      ? Relevant Orders  ? US SCROTUM W/DOPPLER  ? ?  ?  ?No orders of the defined types were placed in this encounter. ? ?Anxiety ?Improved, continue Lexapro 10mg daily ?1 more refill before 12/2021, then we can re order. He has cut the 20mg in half ? ?R Scrotal Mass / cyst vs spermatocele ?Not involving testicle, it is in superior structures ?Order scrotal US doppler [facility] ?Pending result and symptoms we can refer to Urology if indicated ? ?BPH - improved on Flomax, 2-3 months now has symptoms L sided pain, unsure if related. Improved off Flomax. ?L side with varicocele. Will eval on Ultrasound. ?Otherwise symptoms L side seem more hernial inguinal spermatic   cord related ? ?Consider Urology if need vs Gen Surg ? ? ?Follow up plan: ?Return if symptoms worsen or  fail to improve. ? ? ?Alexander Karamalegos, DO ?[facility] ?[facility] ?11/24/2021, 9:27 AM ?

## 2021-11-24 NOTE — Patient Instructions (Addendum)
Thank you for coming to the office today. ? ?Lexapro '10mg'$  daily, I have updated chart, you should have refill on file or 1 more 90 day before end of 12/2021, otherwise let me know when ready for more. ? ?Scrotal ultrasound to eval both the R upper cyst/mass, and Left spermatic cord region pain ? ?[place] ?Address: [address], [place], [place] [address] ?Phone: [phone] ? ?Possible inguinal hernia related pain, but no clear hernia found. ? ?Maybe FLomax related? To be determined, can do repeat trial in future. ? ?Consider Urology if need vs Gen Surg ? ? ?Please schedule a Follow-up Appointment to: No follow-ups on file. ? ?If you have any other questions or concerns, please feel free to call the office or send a message through [place]. You may also schedule an earlier appointment if necessary. ? ?Additionally, you may be receiving a survey about your experience at our office within a few days to 1 week by e-mail or mail. We value your feedback. ? ?Ethan Putnam, DO ?[place] ?

## 2021-11-26 ENCOUNTER — Ambulatory Visit: Payer: 59 | Admitting: Family Medicine

## 2021-12-01 ENCOUNTER — Encounter: Payer: Self-pay | Admitting: Family Medicine

## 2021-12-02 ENCOUNTER — Other Ambulatory Visit: Payer: PRIVATE HEALTH INSURANCE

## 2021-12-05 ENCOUNTER — Other Ambulatory Visit: Payer: Self-pay

## 2021-12-05 ENCOUNTER — Ambulatory Visit
Admission: RE | Admit: 2021-12-05 | Discharge: 2021-12-05 | Disposition: A | Discharge status: Home / Self Care | Payer: 59 | Source: Ambulatory Visit | Attending: Family Medicine | Admitting: Family Medicine

## 2021-12-05 DIAGNOSIS — N5089 Other specified disorders of the male genital organs: Secondary | ICD-10-CM | POA: Diagnosis present

## 2021-12-05 DIAGNOSIS — N4340 Spermatocele of epididymis, unspecified: Secondary | ICD-10-CM | POA: Insufficient documentation

## 2021-12-09 ENCOUNTER — Encounter: Payer: Self-pay | Admitting: Family Medicine

## 2021-12-09 DIAGNOSIS — N401 Enlarged prostate with lower urinary tract symptoms: Secondary | ICD-10-CM

## 2021-12-15 MED ORDER — TAMSULOSIN HCL 0.4 MG PO CAPS: 0.4000 mg | ORAL_CAPSULE | Freq: Every day | ORAL | 0 refills | Status: DC

## 2022-01-03 IMAGING — CT CT CARDIAC CORONARY ARTERY CALCIUM SCORE
3 series · 14 of 20 positions shown, 16 images · non-contrast
Comparison: None.
COMPARISON: None.

Addendum:
EXAM:
OVER-READ INTERPRETATION  CT CHEST

The following report is an over-read performed by radiologist Dr.
Linnety Maeka [REDACTED] on 09/03/2021. This
over-read does not include interpretation of cardiac or coronary
anatomy or pathology. The coronary calcium score interpretation by
the cardiologist is attached.
CLINICAL DATA: Risk stratification
Coronary Calcium Score
TECHNIQUE: The patient was scanned on a Siemens Somatom go.Top Scanner. Axial
non-contrast 3 mm slices were carried out through the heart. The
data set was analyzed on a dedicated work station and scored using
the [place]at[place] method.

[Series 2: sa36 calcium scoring 3.00 · axial · 0.35mm/px · z∈[-1170,-1071]mm · 4 of 57 slices shown]
[im 12/57  vessel]
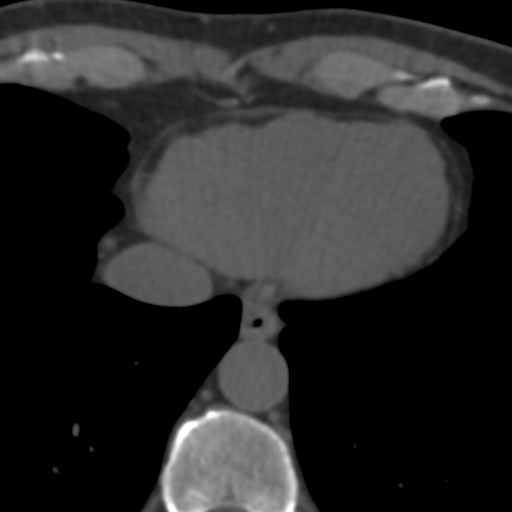
[im 23/57  vessel]
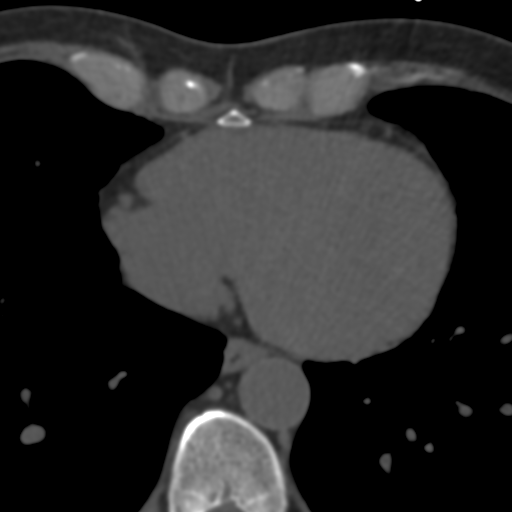
[im 34/57  vessel]
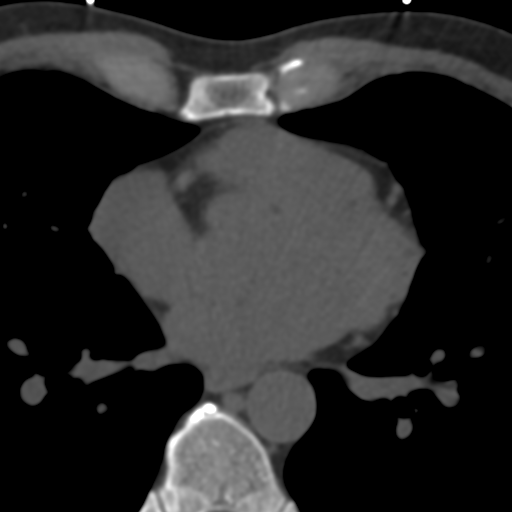
[im 45/57  vessel]
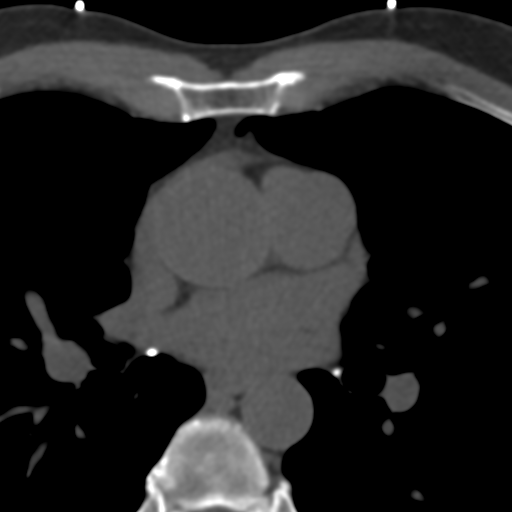

[Series 5: full fov st calcium scoring 3.00 · axial · 0.70mm/px · z∈[-1176,-1065]mm · 5 of 57 slices shown, 7 images]
[im 10/57  vessel]
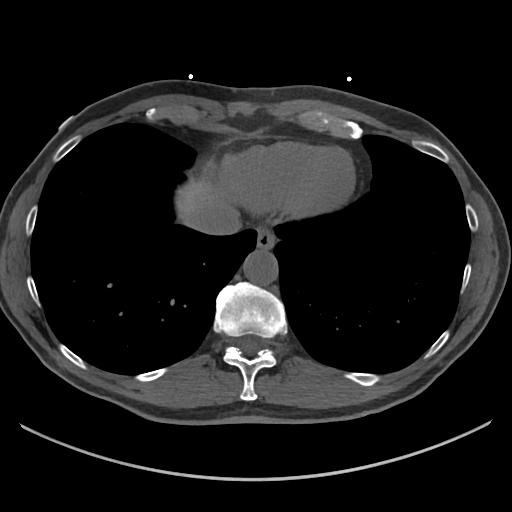
[im 10/57  lung]
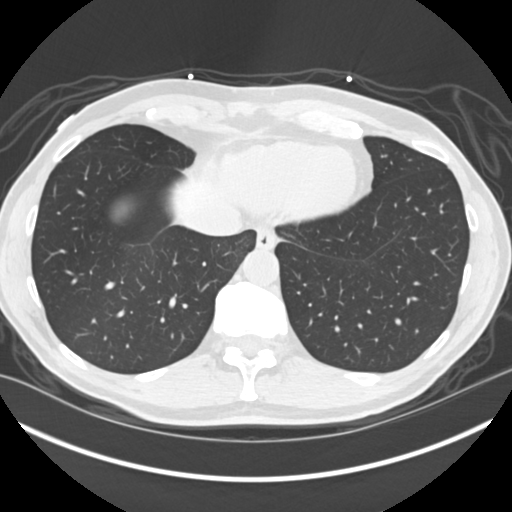
[im 19/57  vessel]
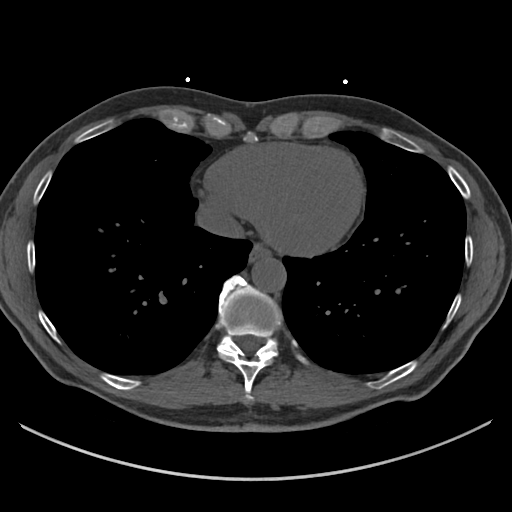
[im 29/57  vessel]
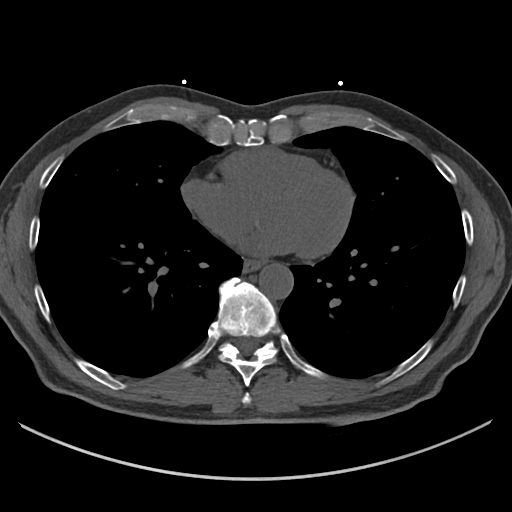
[im 38/57  vessel]
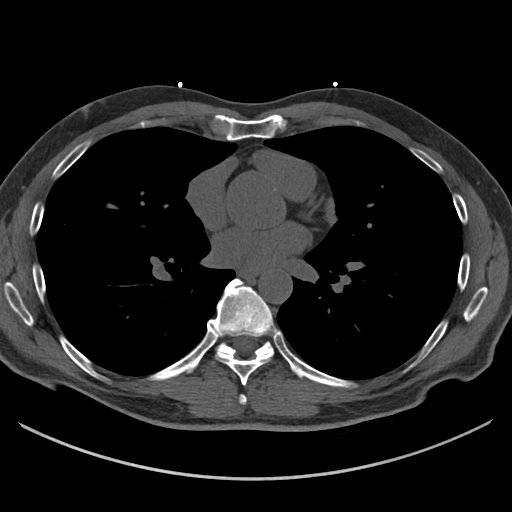
[im 47/57  vessel]
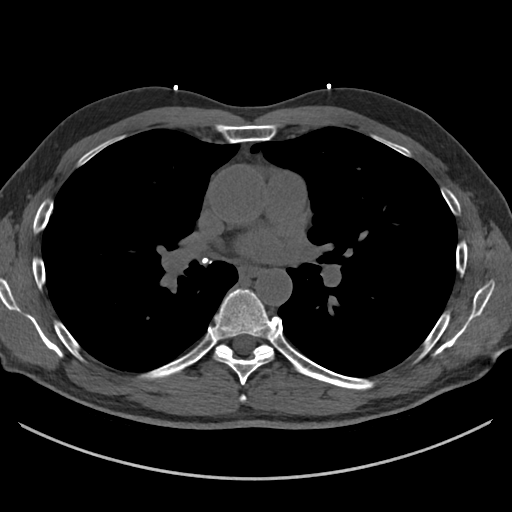
[im 47/57  lung]
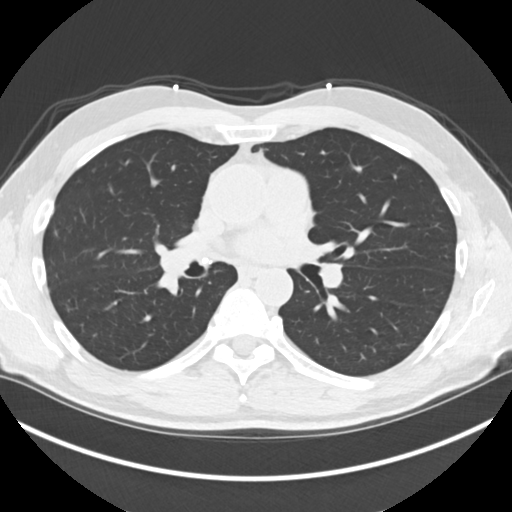

[Series 10: full fov lungs calcium scoring 3.00 ax · axial · 0.70mm/px · z∈[-1176,-1065]mm · 5 of 57 slices shown]
[im 10/57  vessel]
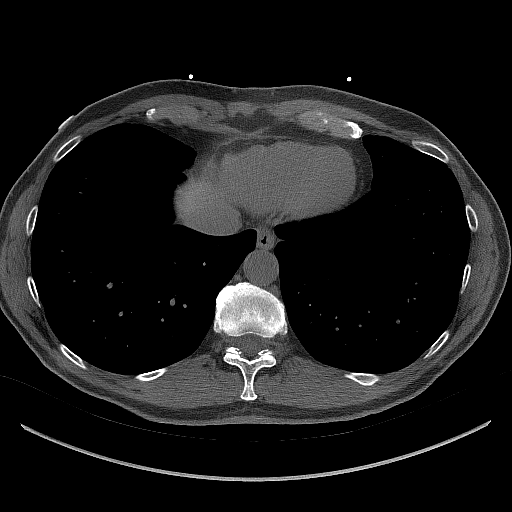
[im 19/57  vessel]
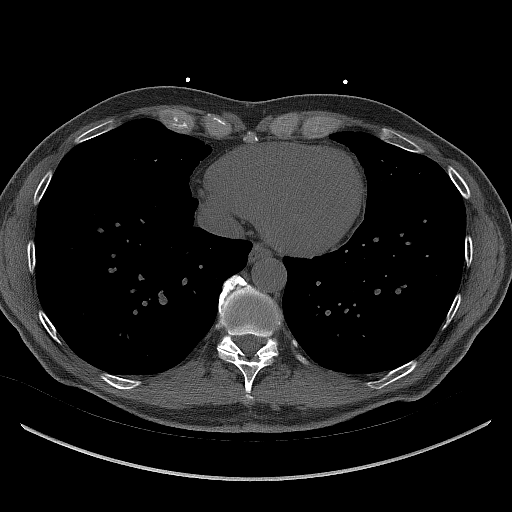
[im 29/57  vessel]
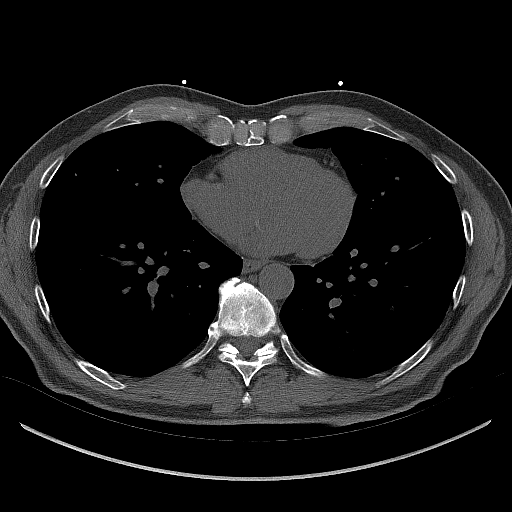
[im 38/57  vessel]
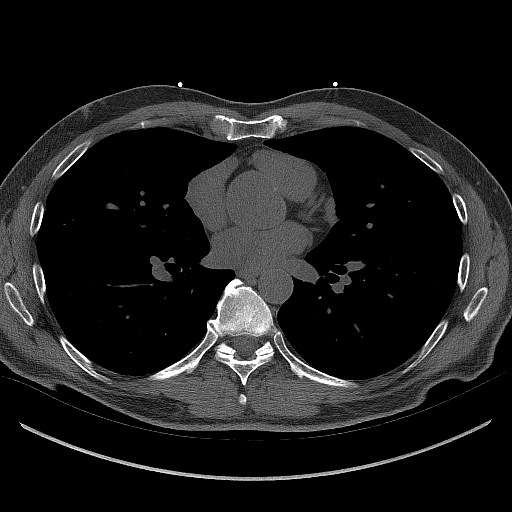
[im 47/57  vessel]
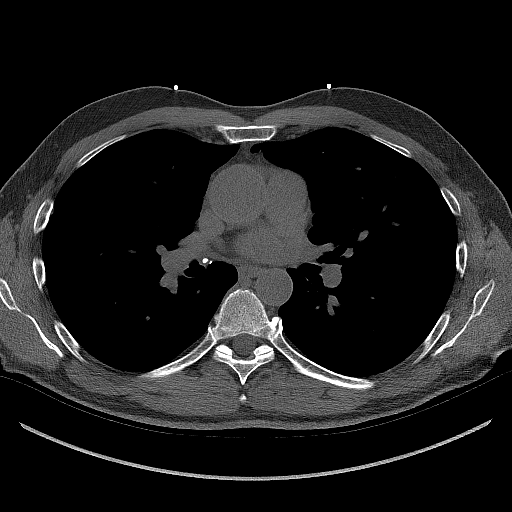

[14 of 20 positions shown; findings below may reference images not displayed]

FINDINGS: Vascular: Heart is normal size. Mild aneurysmal dilatation of the
ascending thoracic aorta measuring 4 cm.

Mediastinum/Nodes: Calcified mediastinal and hilar lymph nodes. No
adenopathy.

Lungs/Pleura: No confluent opacities or effusions.

Upper Abdomen: Imaging into the upper abdomen demonstrates no acute
findings.

Musculoskeletal: Chest wall soft tissues are unremarkable. No acute
bony abnormality.
IMPRESSION: 4 cm ascending thoracic aortic aneurysm. Recommend annual imaging
followup by CTA or MRA. This recommendation follows 5909
ACCF/AHA/AATS/ACR/ASA/SCA/HEBER/MASIEL/SILARS/JUMPER Guidelines for the
Diagnosis and Management of Patients with Thoracic Aortic Disease.
Circulation. 5909; 121: E266-e369. Aortic aneurysm NOS (W81DD-X35.T)

Old granulomatous disease.
FINDINGS: Non-cardiac: See separate report from [REDACTED].

Ascending Aorta: Normal size

Pericardium: Normal

Coronary arteries: Normal origin of left and right coronary
arteries. Distribution of arterial calcifications if present, as
noted below;

LM 0

LAD 0

LCx 0

RCA 0

Total 0

IMPRESSION AND RECOMMENDATION:
1. Normal coronary calcium score of 0. Patient is low risk for
coronary events.

2.  CAC 0, OURARI TIGER0.

3.  Continue heart healthy lifestyle and risk factor modification.

Easton Erives

*** End of Addendum ***
EXAM:
OVER-READ INTERPRETATION  CT CHEST

The following report is an over-read performed by radiologist Dr.
Linnety Maeka [REDACTED] on 09/03/2021. This
over-read does not include interpretation of cardiac or coronary
anatomy or pathology. The coronary calcium score interpretation by
the cardiologist is attached.
FINDINGS: Vascular: Heart is normal size. Mild aneurysmal dilatation of the
ascending thoracic aorta measuring 4 cm.

Mediastinum/Nodes: Calcified mediastinal and hilar lymph nodes. No
adenopathy.

Lungs/Pleura: No confluent opacities or effusions.

Upper Abdomen: Imaging into the upper abdomen demonstrates no acute
findings.

Musculoskeletal: Chest wall soft tissues are unremarkable. No acute
bony abnormality.
IMPRESSION: 4 cm ascending thoracic aortic aneurysm. Recommend annual imaging
followup by CTA or MRA. This recommendation follows 5909
ACCF/AHA/AATS/ACR/ASA/SCA/HEBER/MASIEL/SILARS/JUMPER Guidelines for the
Diagnosis and Management of Patients with Thoracic Aortic Disease.
Circulation. 5909; 121: E266-e369. Aortic aneurysm NOS (W81DD-X35.T)

Old granulomatous disease.

## 2022-02-09 ENCOUNTER — Ambulatory Visit (INDEPENDENT_AMBULATORY_CARE_PROVIDER_SITE_OTHER): Payer: 59 | Admitting: Dermatology

## 2022-02-09 DIAGNOSIS — L408 Other psoriasis: Secondary | ICD-10-CM | POA: Diagnosis not present

## 2022-02-09 MED ORDER — KETOCONAZOLE 2 % EX SHAM: MEDICATED_SHAMPOO | CUTANEOUS | 11 refills | Status: DC

## 2022-02-09 MED ORDER — MOMETASONE FUROATE 0.1 % EX SOLN: CUTANEOUS | 11 refills | Status: DC

## 2022-02-09 NOTE — Patient Instructions (Addendum)

## 2022-02-09 NOTE — Progress Notes (Signed)
    Follow-Up Visit   Subjective  Ethan Glenn is a 61 y.o. male who presents for the following: Seborrheic Dermatitis (Patient here today concerning itchy areas behind ears for 3 weeks now. Tried head and shoulder and over the counter treatments but still reports itchy. ). The patient has spots, moles and lesions to be evaluated, some may be new or changing and the patient has concerns that these could be cancer.  The following portions of the chart were reviewed this encounter and updated as appropriate:  Tobacco  Allergies  Meds  Problems  Med Hx  Surg Hx  Fam Hx     Review of Systems: No other skin or systemic complaints except as noted in HPI or Assessment and Plan.  Objective  Well appearing patient in no apparent distress; mood and affect are within normal limits.  A focused examination was performed including scalp , posterior ears . Relevant physical exam findings are noted in the Assessment and Plan.  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  It tends to be exacerbated by stress and cooler weather.  People who have neurologic disease may experience new onset or exacerbation of existing seborrheic dermatitis.  The condition is not curable but treatable and can be controlled.  Start ketoconazole shampoo 2 % - apply three times per week, massage into scalp and leave in for 3 - 5 minutes before rinsing out [address] [facility] [place].  Recommend shampooing frequently, daily if practical.  Use ketoconazole shampoo at least 3 days a week and other shampoos other days. Start Mometasone 0.1 % lotion - apply topically to aa's nightly 5 x weekly for 2 weeks.  If improving can decrease and use use 3 x weekly   Topical steroids (such as triamcinolone, fluocinolone, fluocinonide, mometasone, clobetasol, halobetasol, betamethasone, hydrocortisone) can cause thinning and lightening of the skin if they are used for too long in the same area. Your physician has selected the right strength medicine for your problem and area affected on the body. Please use your medication only as directed by your physician to prevent side effects.   mometasone (ELOCON) 0.1 % lotion - b/l post auricular scalp Apply topically to aa's nightly 5 times weekly for a couple of weeks. If seeing improvement can use just 3 times weekly ketoconazole (NIZORAL) 2 % shampoo - b/l post auricular scalp apply three times per week, massage into scalp and leave in for 3 - 5 minutes before rinsing out  Return for keep follow up as schedule in december. IRuthell Rummage, CMA, am acting as scribe for Sarina Ser, MD. Documentation: I have reviewed the above documentation for accuracy and completeness, and I agree with the above.  Sarina Ser, MD

## 2022-02-11 ENCOUNTER — Encounter: Payer: Self-pay | Admitting: Family Medicine

## 2022-02-11 DIAGNOSIS — F419 Anxiety disorder, unspecified: Secondary | ICD-10-CM

## 2022-02-16 ENCOUNTER — Encounter: Payer: Self-pay | Admitting: Dermatology

## 2022-03-11 ENCOUNTER — Encounter (INDEPENDENT_AMBULATORY_CARE_PROVIDER_SITE_OTHER): Payer: 59 | Admitting: Family Medicine

## 2022-03-11 DIAGNOSIS — M542 Cervicalgia: Secondary | ICD-10-CM

## 2022-03-11 DIAGNOSIS — M62830 Muscle spasm of back: Secondary | ICD-10-CM

## 2022-03-11 DIAGNOSIS — G8929 Other chronic pain: Secondary | ICD-10-CM | POA: Diagnosis not present

## 2022-03-11 DIAGNOSIS — M159 Polyosteoarthritis, unspecified: Secondary | ICD-10-CM

## 2022-03-11 NOTE — Telephone Encounter (Signed)
Please see the MyChart message reply(ies) for my assessment and plan.    This patient gave consent for this Medical Advice Message and is aware that it may result in a bill to their insurance company, as well as the possibility of receiving a bill for a co-payment or deductible. They are an established patient, but are not seeking medical advice exclusively about a problem treated during an in person or video visit in the last seven days. I did not recommend an in person or video visit within seven days of my reply.    I spent a total of 6 minutes cumulative time within 7 days through MyChart messaging.  Shontae Rosiles, DO   

## 2022-04-03 ENCOUNTER — Other Ambulatory Visit: Payer: Self-pay | Admitting: Family Medicine

## 2022-04-03 DIAGNOSIS — F419 Anxiety disorder, unspecified: Secondary | ICD-10-CM

## 2022-04-06 IMAGING — US US SCROTUM W/ DOPPLER COMPLETE
1 series · 14 of 25 positions shown · non-contrast
Comparison: No prior.

CLINICAL DATA: Scrotal pain and mass.

EXAM:
SCROTAL ULTRASOUND
DOPPLER ULTRASOUND OF THE TESTICLES
TECHNIQUE: Complete ultrasound examination of the testicles, epididymis, and
other scrotal structures was performed. Color and spectral Doppler
ultrasound were also utilized to evaluate blood flow to the
testicles.

[Series 1: us scrotum w/ doppler complete · 0.07mm/px · 14 of 70 slices shown]
[im 1/70]
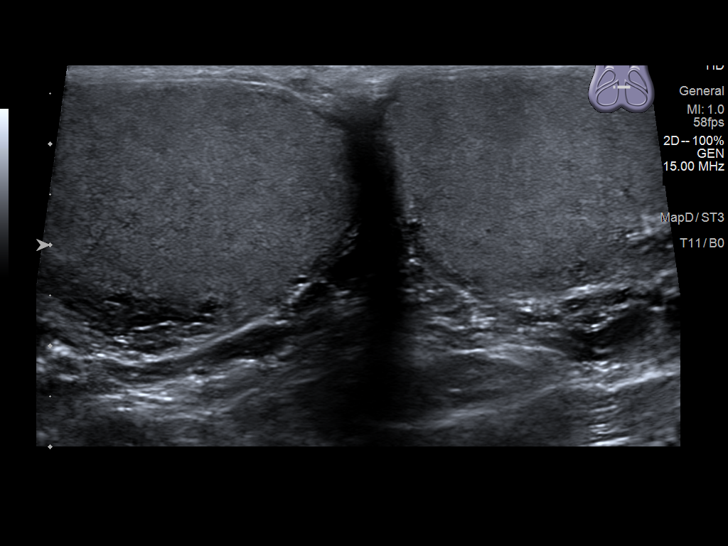
[im 6/70]
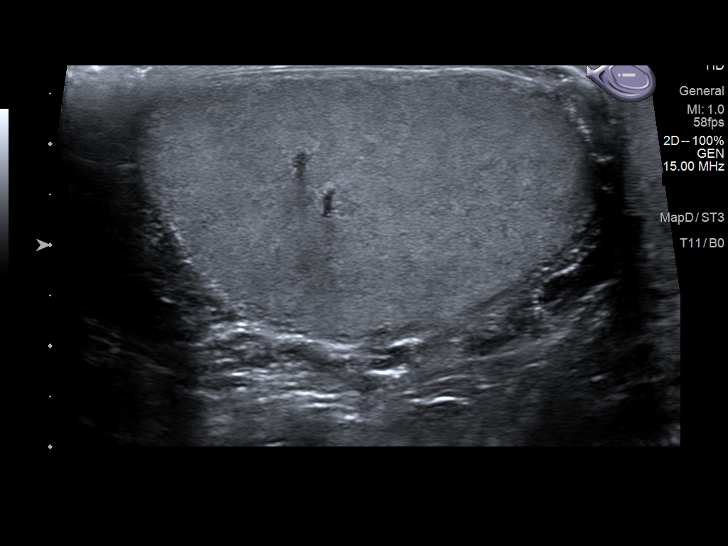
[im 12/70]
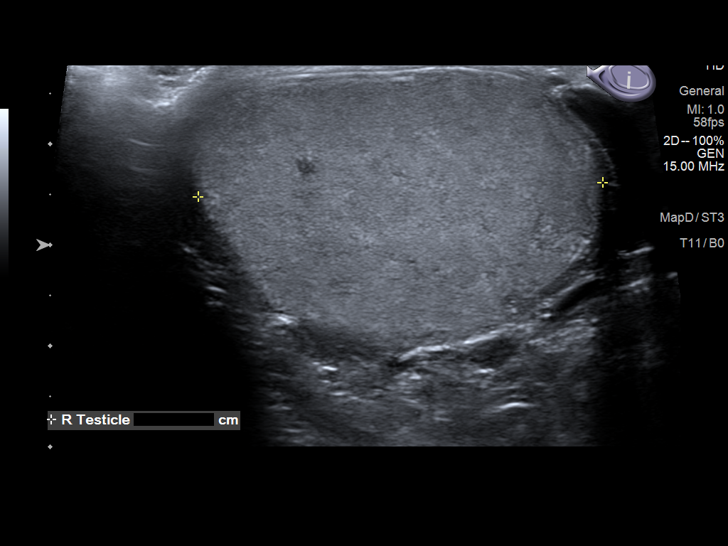
[im 18/70]
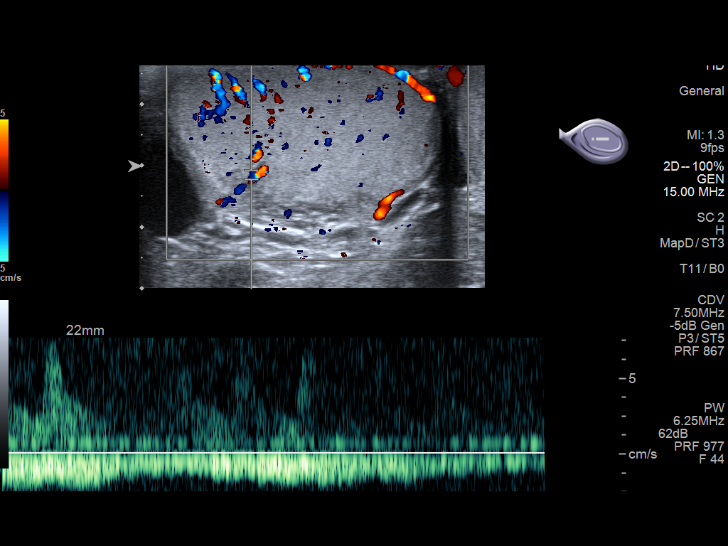
[im 24/70]
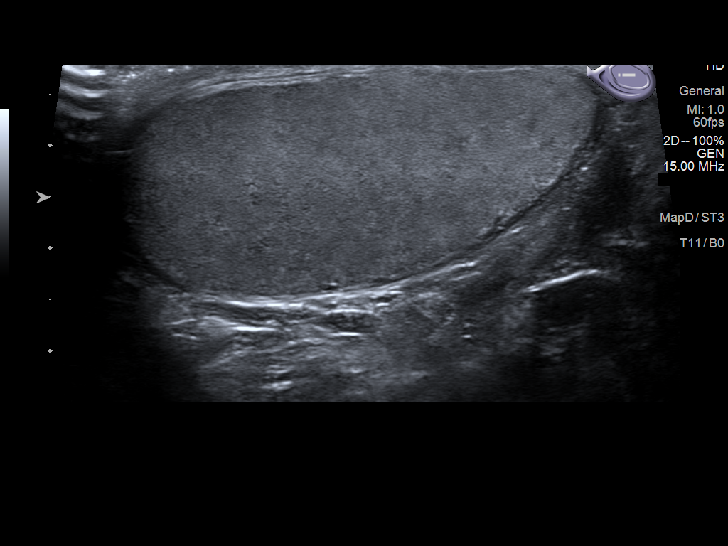
[im 26/70]
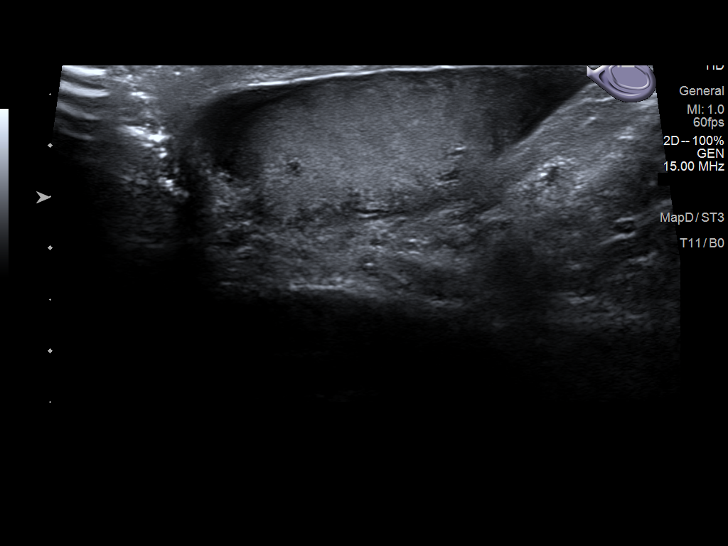
[im 32/70]
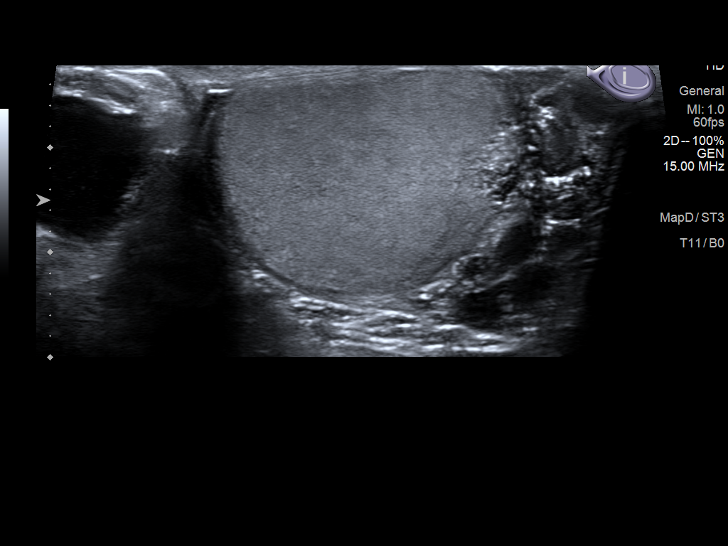
[im 38/70]
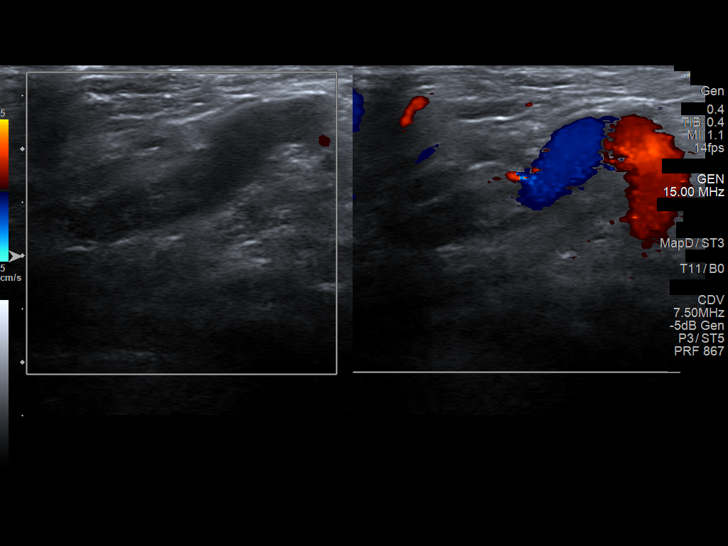
[im 44/70]
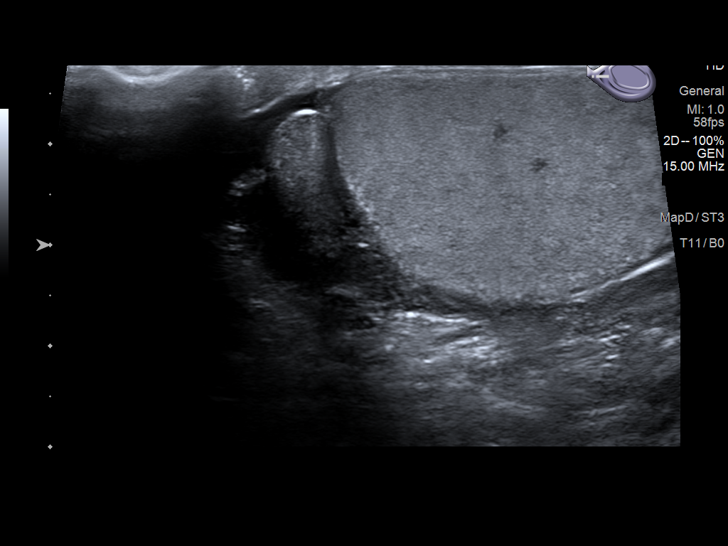
[im 47/70]
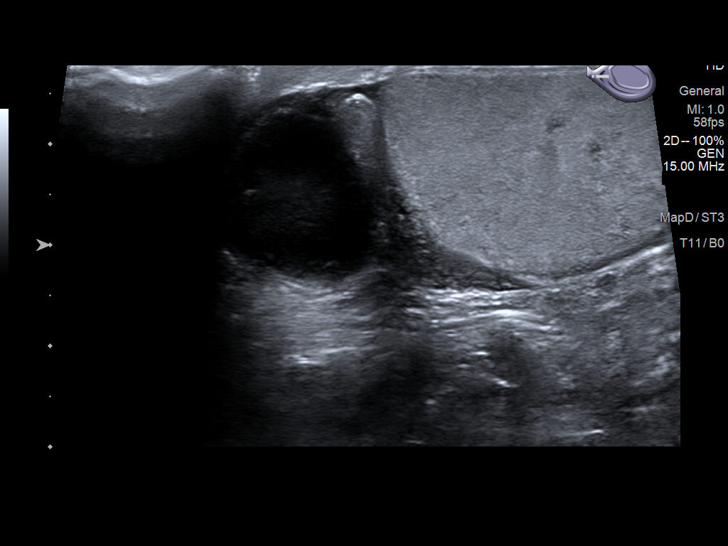
[im 52/70]
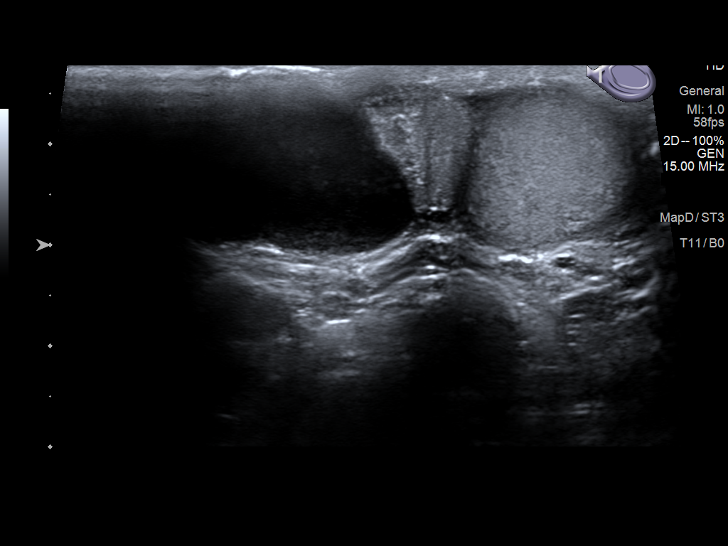
[im 58/70]
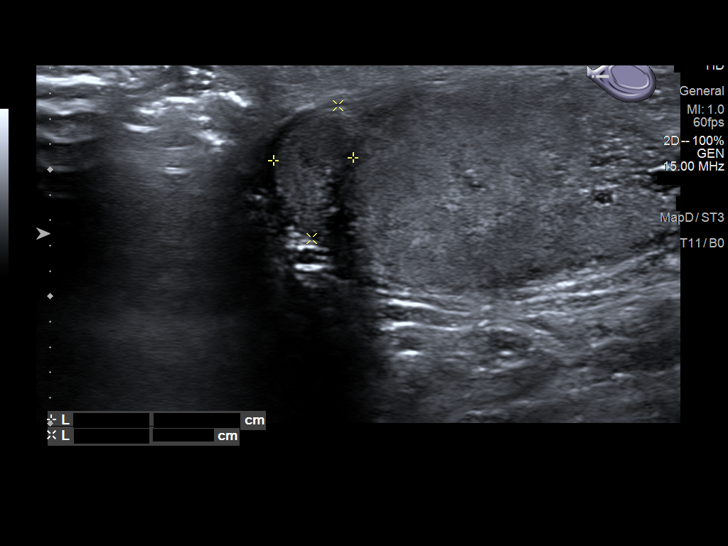
[im 64/70]
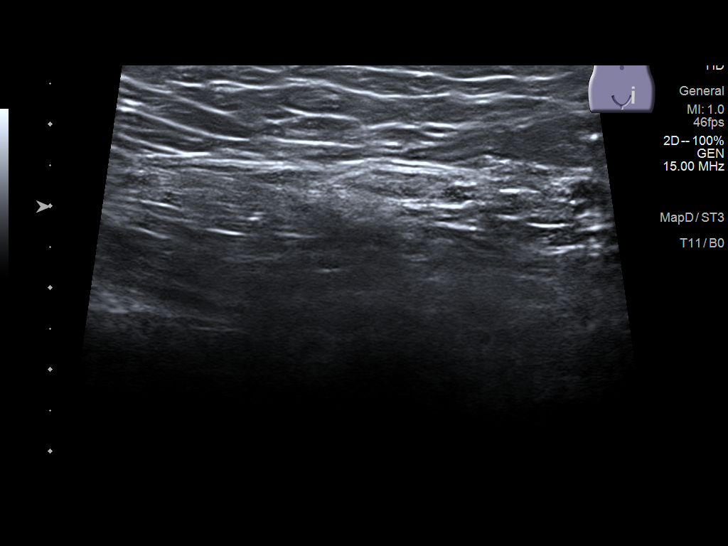
[im 70/70]
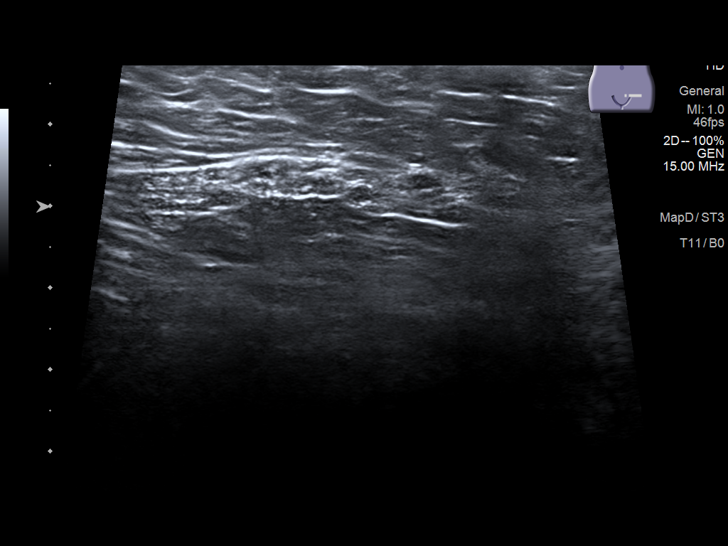

[14 of 25 positions shown; findings below may reference images not displayed]

FINDINGS: Right testicle

Measurements: 4.3 x 2.6 x 4.0 cm. No mass or microlithiasis
visualized.

Left testicle

Measurements: 4.9 x 2.1 x 2.8 cm. No mass or microlithiasis
visualized.

Right epididymis:  2.6 cm cystic structure possibly a spermatocele.

Left epididymis:  Normal in size and appearance.

Hydrocele:  None visualized.

Varicocele:  Bilateral varicoceles left side greater than right.

Pulsed Doppler interrogation of both testes demonstrates normal low
resistance arterial and venous waveforms bilaterally.
IMPRESSION: 1. 2.6 cm cystic structure adjacent to the right epididymis,
possibly a spermatocele.

2.  Bilateral varicoceles left side greater than right.

3.  No evidence of testicular mass or torsion.

## 2022-04-06 NOTE — Telephone Encounter (Signed)
Requested medication (s) are due for refill today: yes  Requested medication (s) are on the active medication list: yes  Last refill:  has rx that is dated current but no receipt from pharm and also labeled historical med so needs to be sent back to office  Future visit scheduled: 04/10/22 this week.  Notes to clinic:  looks like current script however no receipt from pharm and labeled historic med, please assess.      Requested Prescriptions  Pending Prescriptions Disp Refills   escitalopram (LEXAPRO) 20 MG tablet [Pharmacy Med Name: ESCITALOPRAM 20 MG TABLET] 90 tablet 3    Sig: TAKE 1 TABLET BY MOUTH EVERY DAY     Psychiatry:  Antidepressants - SSRI Passed - 04/03/2022  7:24 PM      Passed - Valid encounter within last 6 months    Recent Outpatient Visits           4 months ago Spermatic cord pain   [place], DO   7 months ago Benign prostatic hyperplasia with nocturia   [place], DO   1 year ago Primary osteoarthritis of both hands   [facility] Olin Hauser, DO   1 year ago COVID-19 virus infection   [place], DO   1 year ago Encounter to establish care   [facility], Lupita Raider, FNP       Future Appointments             In 4 days Parks Ranger, Devonne Doughty, DO [facility], [place]   In 4 months Ralene Bathe, MD [place]

## 2022-04-10 ENCOUNTER — Encounter: Payer: Self-pay | Admitting: Family Medicine

## 2022-04-10 ENCOUNTER — Ambulatory Visit: Payer: 59 | Admitting: Family Medicine

## 2022-04-10 VITALS — BP 122/66 | HR 77 | Ht 74.0 in | Wt 196.6 lb

## 2022-04-10 DIAGNOSIS — M7711 Lateral epicondylitis, right elbow: Secondary | ICD-10-CM

## 2022-04-10 DIAGNOSIS — G5621 Lesion of ulnar nerve, right upper limb: Secondary | ICD-10-CM | POA: Diagnosis not present

## 2022-04-10 NOTE — Patient Instructions (Addendum)
Thank you for coming to the office today.  Goal is for rest to avoid provoking the nerve. 4-6 weeks is approximate time table.  We could consider referral to Neurologist for nerve conduction study first and then they can consider injection or procedure to relieve nerve.  Likely Ulnar Nerve Entrapment. At the elbow.  Heat is good for stiff sore and pain Ice is good for cool downs after activity  You most likely have "Tennis Elbow" or "Lateral Epicondylitis" - This is inflammation or tendonitis affecting the muscles of your forearm - Usually it is caused by frequent or daily repetitive activities using your arms, lifting, rotating, pulling, twisting - it can happen over days to months or longer from repetitive strain  One of the most important parts of treatment is rest and avoiding the activities that make it worse.  START anti inflammatory topical - OTC Voltaren (generic Diclofenac) topical 2-4 times a day as needed for pain swelling of affected joint for 1-2 weeks or longer.  Instead may use Naproxen.  - It is safe to take Tylenol Ext Str '500mg'$  tabs - take 1 to 2 (max dose '1000mg'$ ) every 6 hours as needed for breakthrough pain, max 24 hour daily dose is 6 to 8 tablets or '4000mg'$   Use RICE therapy: - R - Rest / relative rest with activity modification avoid overuse of joint - I - Ice packs (make sure you use a towel or sock / something to protect skin) - C - Compression with ACE wrap to apply pressure and reduce swelling allowing more support  Try a Forearm Tennis Elbow Strap over muscle as demonstrated - use with repetitive activity to reduce strain of muscle    - E - Elevation - if significant swelling, lift leg above heart level (toes above your nose) to help reduce swelling, most helpful at night after day of being on your feet  May consider Physical Therapy referral if interested   Please schedule a Follow-up Appointment to: Return in about 4 weeks (around 05/08/2022), or if  symptoms worsen or fail to improve, for 4 week follow-up R elbow ulnar nerve tendonitis.  If you have any other questions or concerns, please feel free to call the office or send a message through [place]. You may also schedule an earlier appointment if necessary.  Additionally, you may be receiving a survey about your experience at our office within a few days to 1 week by e-mail or mail. We value your feedback.  Nobie Putnam, DO [facility], [place]  Cubital Tunnel Syndrome  Cubital tunnel syndrome is a condition that causes pain and weakness of the forearm and hand. It happens when one of the nerves that runs along the inside of the elbow joint (ulnar nerve) becomes irritated. This condition is usually caused by repeated arm motions that are done during sports or work-related activities. What are the causes? This condition may be caused by: Increased pressure on the ulnar nerve at the elbow, arm, or forearm. This can result from: Irritation caused by repeated elbow bending. Poorly healed elbow fractures. Tumors in the elbow. These are usually noncancerous (benign). Scar tissue that develops in the elbow after an injury. Bony growths (spurs) near the ulnar nerve. Stretching of the nerve due to loose elbow ligaments. Trauma to the nerve at the elbow. What increases the risk? The following factors may make you more likely to develop this condition: Doing manual labor that requires frequent bending of the elbow. Playing sports that  include repeated or strenuous throwing motions, such as baseball. Playing contact sports, such as football or lacrosse. Not warming up properly before activities. Having diabetes. Having an underactive thyroid (hypothyroidism). What are the signs or symptoms? Symptoms of this condition include: Clumsiness or weakness of the hand. Tenderness of the inner elbow. Aching or soreness of the inner elbow, forearm, or fingers, especially the  little finger or the ring finger. Increased pain when forcing the elbow to bend. Reduced control when throwing objects. Tingling, numbness, or a burning feeling inside the forearm or in part of the hand or fingers, especially the little finger or the ring finger. Sharp pains that shoot from the elbow down to the wrist and hand. The inability to grip or pinch hard. How is this diagnosed? This condition is diagnosed based on: Your symptoms and medical history. Your health care provider will also ask for details about any injury. A physical exam. You may also have tests, including: Electromyogram (EMG). This test measures electrical signals sent by your nerves into the muscles. Nerve conduction study. This test measures how well electrical signals pass through your nerves. Imaging tests, such as X-rays, ultrasound, and MRI. These tests check for possible causes of your condition. How is this treated? This condition may be treated by: Stopping the activities that are causing your symptoms to get worse. Icing and taking medicines to reduce pain and swelling. Wearing a splint to prevent your elbow from bending, or wearing an elbow pad where the ulnar nerve is closest to the skin. Working with a physical therapist in less severe cases. This may help to: Decrease your symptoms. Improve the strength and range of motion of your elbow, forearm, and hand. If these treatments do not help, surgery may be needed. Follow these instructions at home: If you have a splint: Wear the splint as told by your health care provider. Remove it only as told by your health care provider. Loosen the splint if your fingers tingle, become numb, or turn cold and blue. Keep the splint clean. If the splint is not waterproof: Do not let it get wet. Cover it with a watertight covering when you take a bath or shower. Managing pain, stiffness, and swelling  If directed, put ice on the injured area: Put ice in a plastic  bag. Place a towel between your skin and the bag. Leave the ice on for 20 minutes, 2-3 times a day. Move your fingers often to avoid stiffness and to lessen swelling. Raise (elevate) the injured area above the level of your heart while you are sitting or lying down. General instructions Take over-the-counter and prescription medicines only as told by your health care provider. Do any exercise or physical therapy as told by your health care provider. Do not drive or use heavy machinery while taking prescription pain medicine. If you were given an elbow pad, wear it as told by your health care provider. Keep all follow-up visits as told by your health care provider. This is important. Contact a health care provider if: Your symptoms get worse. Your symptoms do not get better with treatment. You have new pain. Your hand on the injured side feels numb or cold. Summary Cubital tunnel syndrome is a condition that causes pain and weakness of the forearm and hand. You are more likely to develop this condition if you do work or play sports that involve repeated arm movements. This condition is often treated by stopping repetitive activities, applying ice, and using anti-inflammatory medicines. In  rare cases, surgery may be needed. This information is not intended to replace advice given to you by your health care provider. Make sure you discuss any questions you have with your health care provider. Document Revised: 10/30/2020 Document Reviewed: 10/30/2020 Elsevier Patient Education  [place].

## 2022-04-10 NOTE — Progress Notes (Unsigned)
Subjective:    Patient ID: Ethan Glenn, male    DOB: 13-Oct-1960, 60 y.o.   MRN: [id number]  Ethan Glenn is a 61 y.o. male presenting on 04/10/2022 for Elbow Pain   HPI  Right Elbow Pain, Chronic Tennis Elbow  Right Handed  Chronic problem now gradual persistent symptom R elbow pain lateral onset 6-8 weeks ago initially, no trauma or fall injury. Recently flared up by physical repetitive work with gardening and labor at home. He often is working through the pain. It can be moderate to severe 6 out of 10, worse if bumps elbow accidentally causes more pain. Describes dull ache lateral R elbow can even feel some burning pain and some radiation into R forearm. He can feel symptoms into his R 4th and 5th finger.  Pain is worse with R are elbow extension.  He had L 5th, 4th fingers treated w/ trigger finger. And also R 5th finger but needs 4th finger.  He had similar pain with L thumb arthritis bone spur. That one required a cortisone shot.  inc time on computer, return to working as Pharmacist, hospital.  He prefers to not rest.   Nocturia Previously waking up 2-3 times over night Since on Tamsulosin now improving Improved daytime symptoms Still has 1 x nocturia      04/10/2022    9:33 AM 11/24/2021    9:24 AM 08/28/2021   10:36 AM  Depression screen PHQ 2/9  Decreased Interest 0 0 0  Down, Depressed, Hopeless 0 0 0  PHQ - 2 Score 0 0 0  Altered sleeping _0 Tired, decreased energy 0 1 1  Change in appetite 1 0 0  Feeling bad or failure about yourself  0 0 0  Trouble concentrating 0 0 0  Moving slowly or fidgety/restless 0 0 0  Suicidal thoughts 0 0 0  PHQ-9 Score _1 Difficult doing work/chores Somewhat difficult Not difficult at all Somewhat difficult    Social History   Tobacco Use   Smoking status: Former    Types: Cigarettes    Quit date: 08/07/1986    Years since quitting: 35.7   Smokeless tobacco: Never  Vaping Use   Vaping Use: Never used  Substance Use  Topics   Alcohol use: Yes    Alcohol/week: 3.0 standard drinks of alcohol    Types: 3 Standard drinks or equivalent per week   Drug use: Never    Review of Systems Per HPI unless specifically indicated above     Objective:    BP 122/66   Pulse 77   Ht _2  (1.88 m)   Wt 196 lb 9.6 oz (89.2 kg)   SpO2 99%   BMI 25.24 kg/m   Wt Readings from Last 3 Encounters:  04/10/22 196 lb 9.6 oz (89.2 kg)  11/24/21 194 lb 9.6 oz (88.3 kg)  09/08/21 194 lb 9.6 oz (88.3 kg)    Physical Exam Vitals and nursing note reviewed.  Constitutional:      General: He is not in acute distress.    Appearance: Normal appearance. He is well-developed. He is not diaphoretic.     Comments: Well-appearing, comfortable, cooperative  HENT:     Head: Normocephalic and atraumatic.  Eyes:     General:        Right eye: No discharge.        Left eye: No discharge.     Conjunctiva/sclera: Conjunctivae normal.  Cardiovascular:     Rate  and Rhythm: Normal rate.  Pulmonary:     Effort: Pulmonary effort is normal.  Musculoskeletal:     Comments: Full ROM R Elbow RUE Localized R cubital tunnel pain on palpation over groove / nerve. No olecranon or bony tenderness. Some mild discomfort on palpation R forearm proximal.  Skin:    General: Skin is warm and dry.     Findings: No erythema or rash.  Neurological:     Mental Status: He is alert and oriented to person, place, and time.  Psychiatric:        Mood and Affect: Mood normal.        Behavior: Behavior normal.        Thought Content: Thought content normal.     Comments: Well groomed, good eye contact, normal speech and thoughts    Results for orders placed or performed in visit on 08/29/21  COMPLETE METABOLIC PANEL WITH GFR  Result Value Ref Range   Glucose, Bld 86 65 - 99 mg/dL   BUN 18 7 - 25 mg/dL   Creat 1.06 0.70 - 1.30 mg/dL   eGFR 81 > OR = 60 mL/min/1.60m   BUN/Creatinine Ratio NOT APPLICABLE 6 - 22 (calc)   Sodium 138 135 - 146 mmol/L    Potassium 4.2 3.5 - 5.3 mmol/L   Chloride 105 98 - 110 mmol/L   CO2 25 20 - 32 mmol/L   Calcium 9.9 8.6 - 10.3 mg/dL   Total Protein 6.6 6.1 - 8.1 g/dL   Albumin 4.2 3.6 - 5.1 g/dL   Globulin 2.4 1.9 - 3.7 g/dL (calc)   AG Ratio 1.8 1.0 - 2.5 (calc)   Total Bilirubin 1.1 0.2 - 1.2 mg/dL   Alkaline phosphatase (APISO) 71 35 - 144 U/L   AST 18 10 - 35 U/L   ALT 16 9 - 46 U/L  Hemoglobin A1c  Result Value Ref Range   Hgb A1c MFr Bld 5.2 <5.7 % of total Hgb   Mean Plasma Glucose 103 mg/dL   eAG (mmol/L) 5.7 mmol/L  Lipid panel  Result Value Ref Range   Cholesterol 195 <200 mg/dL   HDL 61 > OR = 40 mg/dL   Triglycerides 64 <150 mg/dL   LDL Cholesterol (Calc) 118 (H) mg/dL (calc)   Total CHOL/HDL Ratio 3.2 <5.0 (calc)   Non-HDL Cholesterol (Calc) 134 (H) <130 mg/dL (calc)  PSA  Result Value Ref Range   PSA 1.05 < OR = 4.00 ng/mL      Assessment & Plan:   Problem List Items Addressed This Visit   None Visit Diagnoses     Right lateral epicondylitis    -  Primary   Ulnar nerve entrapment at elbow, right           Classic for ulnar nerve entrapment / cubital syndrome R elbow, with symptoms into R hand 4th 5th fingers. Additionally seems to have forearm symptoms of possible lateral epicondylitis  Start with conservative care Improved rest plan as discussed avoid repetitive activities and over-exertion with R arm Limit elbow flexion or leaning May trial forearm strap Topical ice / heat PRN Topical Voltaren OTC Tylenol RICE therapy  Future could consider referral to Neurologist for nerve conduction study first and then they can consider injection or procedure to relieve nerve.  No orders of the defined types were placed in this encounter.     Follow up plan: Return in about 4 weeks (around 05/08/2022), or if symptoms worsen or fail to improve, for 4  week follow-up R elbow ulnar nerve tendonitis.   Nobie Putnam, [facility] 04/10/2022, 9:47 AM

## 2022-05-15 ENCOUNTER — Ambulatory Visit
Admission: RE | Admit: 2022-05-15 | Discharge: 2022-05-15 | Disposition: A | Discharge status: Home / Self Care | Payer: 59 | Source: Ambulatory Visit | Attending: Urgent Care | Admitting: Urgent Care

## 2022-05-15 VITALS — BP 119/70 | HR 91 | Temp 98.4°F | Resp 18

## 2022-05-15 DIAGNOSIS — R051 Acute cough: Secondary | ICD-10-CM

## 2022-05-15 DIAGNOSIS — R0981 Nasal congestion: Secondary | ICD-10-CM

## 2022-05-15 DIAGNOSIS — J019 Acute sinusitis, unspecified: Secondary | ICD-10-CM | POA: Diagnosis not present

## 2022-05-15 MED ORDER — CETIRIZINE HCL 10 MG PO TABS: 10.0000 mg | ORAL_TABLET | Freq: Every day | ORAL | 0 refills | Status: DC

## 2022-05-15 MED ORDER — AMOXICILLIN-POT CLAVULANATE 875-125 MG PO TABS: 1.0000 | ORAL_TABLET | Freq: Two times a day (BID) | ORAL | 0 refills | Status: DC

## 2022-05-15 MED ORDER — PROMETHAZINE-DM 6.25-15 MG/5ML PO SYRP: 2.5000 mL | ORAL_SOLUTION | Freq: Three times a day (TID) | ORAL | 0 refills | Status: DC | PRN

## 2022-05-15 MED ORDER — PSEUDOEPHEDRINE HCL 60 MG PO TABS: 60.0000 mg | ORAL_TABLET | Freq: Three times a day (TID) | ORAL | 0 refills | Status: DC | PRN

## 2022-05-15 NOTE — ED Triage Notes (Signed)
Patient presents to Urgent Care with complaints of, SOB, cough x 2 weeks. He states cough is dry sometimes and productive sometimes. Negative covid test. Treating symptoms with mucinex and cough suppressants.   Denies fever.

## 2022-05-15 NOTE — ED Provider Notes (Signed)
[place]   MRN: [id number] DOB: 1960-12-28  Subjective:   Ethan Glenn is a 61 y.o. male presenting for 2 week history of persistent coughing, sinus congestion, left-sided maxillary fullness, right-sided ear fullness.  Patient has been using over-the-counter medications with minimal relief.  No overt chest pain, shortness of breath or wheezing.  No history of respiratory disorders.  Patient is not a smoker.  No current facility-administered medications for this encounter.  Current Outpatient Medications:    bupivacaine (MARCAINE) 0.5 % injection, bupivacaine HCl 0.5 % (5 mg/mL) injection solution  Take 50 mg by injection route., Disp: , Rfl:    escitalopram (LEXAPRO) 20 MG tablet, TAKE 1 TABLET BY MOUTH EVERY DAY, Disp: 90 tablet, Rfl: 3   ketoconazole (NIZORAL) 2 % shampoo, apply three times per week, massage into scalp and leave in for 3 - 5 minutes before rinsing out, Disp: 120 mL, Rfl: 11   mometasone (ELOCON) 0.1 % lotion, Apply topically to aa's nightly 5 times weekly for a couple of weeks. If seeing improvement can use just 3 times weekly, Disp: 60 mL, Rfl: 11   Multiple Vitamins-Minerals (PRESERVISION AREDS PO), Take by mouth daily., Disp: , Rfl:    SUMAtriptan (IMITREX) 20 MG/ACT nasal spray, Place 1 spray (20 mg total) into the nose every 2 (two) hours as needed for migraine or headache. May repeat in 2 hours if headache persists or recurs., Disp: 1 each, Rfl: 2   tamsulosin (FLOMAX) 0.4 MG CAPS capsule, Take 1 capsule (0.4 mg total) by mouth daily after supper., Disp: 90 capsule, Rfl: 0   Allergies  Allergen Reactions   Erythromycin Nausea Only   Quinolones Other (See Comments)    Aortic Aneurysm - should avoid Fluoroquinolone medications.    Past Medical History:  Diagnosis Date   ADHD    Allergic rhinitis    Anxiety    Frequent headaches    Migraine      Past Surgical History:  Procedure Laterality Date   HERNIA REPAIR  1965   KNEE SURGERY  Left 1984    Family History  Problem Relation Age of Onset   Hypertension Father    Stroke Father     Social History   Tobacco Use   Smoking status: Former    Types: Cigarettes    Quit date: 08/07/1986    Years since quitting: 35.7   Smokeless tobacco: Never  Vaping Use   Vaping Use: Never used  Substance Use Topics   Alcohol use: Yes    Alcohol/week: 3.0 standard drinks of alcohol    Types: 3 Standard drinks or equivalent per week   Drug use: Never    ROS   Objective:   Vitals: BP 119/70   Pulse 91   Temp 98.4 F (36.9 C)   Resp 18   SpO2 97%   Physical Exam Constitutional:      General: He is not in acute distress.    Appearance: Normal appearance. He is well-developed and normal weight. He is not ill-appearing, toxic-appearing or diaphoretic.  HENT:     Head: Normocephalic and atraumatic.     Right Ear: Tympanic membrane, ear canal and external ear normal. There is no impacted cerumen.     Left Ear: Tympanic membrane, ear canal and external ear normal. There is no impacted cerumen.     Nose: Nose normal. No congestion or rhinorrhea.     Mouth/Throat:     Mouth: Mucous membranes are moist.  Pharynx: No oropharyngeal exudate or posterior oropharyngeal erythema.  Eyes:     General: No scleral icterus.       Right eye: No discharge.        Left eye: No discharge.     Extraocular Movements: Extraocular movements intact.     Conjunctiva/sclera: Conjunctivae normal.  Cardiovascular:     Rate and Rhythm: Normal rate and regular rhythm.     Heart sounds: Normal heart sounds. No murmur heard.    No friction rub. No gallop.  Pulmonary:     Effort: Pulmonary effort is normal. No respiratory distress.     Breath sounds: Normal breath sounds. No stridor. No wheezing, rhonchi or rales.  Musculoskeletal:     Cervical back: Normal range of motion and neck supple. No rigidity. No muscular tenderness.  Neurological:     General: No focal deficit present.      Mental Status: He is alert and oriented to person, place, and time.  Psychiatric:        Mood and Affect: Mood normal.        Behavior: Behavior normal.        Thought Content: Thought content normal.      Assessment and Plan :   PDMP not reviewed this encounter.  1. Acute sinusitis, recurrence not specified, unspecified location   2. Acute cough   3. Sinus congestion    Will start empiric treatment for sinusitis with Augmentin.  Recommended supportive care otherwise including the use of oral antihistamine, decongestant.  Deferred imaging given clear cardiopulmonary exam, hemodynamically stable vital signs.  Offered an oral prednisone course given the persistent and bothersome cough but patient prefers to hold off on this for now.  Should he have no improvement, recommend a recheck and obtain a chest x-ray at that point, started on oral steroids.  Counseled patient on potential for adverse effects with medications prescribed/recommended today, ER and return-to-clinic precautions discussed, patient verbalized understanding.    Jaynee Eagles, PA-C 05/15/22 1017

## 2022-06-01 ENCOUNTER — Telehealth: Payer: 59 | Admitting: Family Medicine

## 2022-06-01 ENCOUNTER — Encounter: Payer: Self-pay | Admitting: Family Medicine

## 2022-06-01 VITALS — Ht 74.0 in | Wt 196.0 lb

## 2022-06-01 DIAGNOSIS — G8929 Other chronic pain: Secondary | ICD-10-CM

## 2022-06-01 DIAGNOSIS — R058 Other specified cough: Secondary | ICD-10-CM

## 2022-06-01 DIAGNOSIS — M7711 Lateral epicondylitis, right elbow: Secondary | ICD-10-CM | POA: Diagnosis not present

## 2022-06-01 DIAGNOSIS — G5621 Lesion of ulnar nerve, right upper limb: Secondary | ICD-10-CM

## 2022-06-01 DIAGNOSIS — M25521 Pain in right elbow: Secondary | ICD-10-CM

## 2022-06-01 NOTE — Patient Instructions (Addendum)
Thank you for coming to the office today.  Referral back to [facility] Dr Iran Planas for the Right elbow pain. He does management of all issues upper extremity hand wrist elbow.  You may need injection or further imaging and management.  They should contact you with apt soon, and if not heard back you can call them to check status.   Please schedule a Follow-up Appointment to: Return if symptoms worsen or fail to improve.  If you have any other questions or concerns, please feel free to call the office or send a message through [place]. You may also schedule an earlier appointment if necessary.  Additionally, you may be receiving a survey about your experience at our office within a few days to 1 week by e-mail or mail. We value your feedback.  Nobie Putnam, DO [place]

## 2022-06-01 NOTE — Progress Notes (Addendum)
Subjective:    Patient ID: Ethan Glenn, male    DOB: Oct 25, 1960, 61 y.o.   MRN: [id number]  Ethan Glenn is a 62 y.o. male presenting on 06/01/2022 for Elbow Pain  Virtual / Telehealth Encounter - Video Visit via MyChart The purpose of this virtual visit is to provide medical care while limiting exposure to the novel coronavirus (COVID19) for both patient and office staff.  Consent was obtained for remote visit:  Yes.   Answered questions that patient had about telehealth interaction:  Yes.   I discussed the limitations, risks, security and privacy concerns of performing an evaluation and management service by video/telephone. I also discussed with the patient that there may be a patient responsible charge related to this service. The patient expressed understanding and agreed to proceed.  Patient Location: Home Provider Location: Ethan Glenn (Office)  Participants in virtual visit: - Patient: Ethan Glenn "Ethan Glenn" - CMA: Ethan Glenn, CMA - Provider: Dr Parks Glenn   HPI  Right Elbow Pain, Chronic Tennis Elbow   Right Handed   Last visit 04/10/22 initial visit for R elbow pain, diagnosed lateral epicondylitis, conservative therapy given, limited results. He states symptoms are unresolved. Unchanged with persistent aching pain even at resting position. Worse with physical repetitive activity and strenuous work with arms. - Seems to be more chronic, no trauma or injury Recently flared up by physical repetitive work with gardening and labor at home. He often is working through the pain. It can be moderate to severe 6 out of 10, worse if bumps elbow accidentally causes more pain. Describes dull ache lateral R elbow can even feel some burning pain and some radiation into R forearm. He can feel symptoms into his R 4th and 5th finger. Pain is worse with R are elbow extension.   He had similar pain with L thumb arthritis bone spur. That one required a cortisone  shot. Previously seen [facility] Ethan Iran Planas for trigger fingers  Additional update URI, recent illness with Ethan Glenn and recurrent cough and symptoms. He had similar, describes dry cough and occasional productive, he feels 98% better, still has recurrent cough. Finished antibiotics and cough medicine from urgent care       06/01/2022   11:11 AM 04/10/2022    9:33 AM 11/24/2021    9:24 AM  Depression screen PHQ 2/9  Decreased Interest 0 0 0  Down, Depressed, Hopeless 0 0 0  PHQ - 2 Score 0 0 0  Altered sleeping 2 2 2   Tired, decreased energy 0 0 1  Change in appetite 1 1 0  Feeling bad or failure about yourself  0 0 0  Trouble concentrating 0 0 0  Moving slowly or fidgety/restless 0 0 0  Suicidal thoughts 0 0 0  PHQ-9 Score 3 3 3   Difficult doing work/chores Somewhat difficult Somewhat difficult Not difficult at all    Social History   Tobacco Use   Smoking status: Former    Types: Cigarettes    Quit date: 08/07/1986    Years since quitting: 35.8   Smokeless tobacco: Never  Vaping Use   Vaping Use: Never used  Substance Use Topics   Alcohol use: Yes    Alcohol/week: 3.0 standard drinks of alcohol    Types: 3 Standard drinks or equivalent per week   Drug use: Never    Review of Systems Per HPI unless specifically indicated above     Objective:    Ht 6' 2"  (1.88 m)  Wt 196 lb (88.9 kg)   BMI 25.16 kg/m   Wt Readings from Last 3 Encounters:  06/01/22 196 lb (88.9 kg)  04/10/22 196 lb 9.6 oz (89.2 kg)  11/24/21 194 lb 9.6 oz (88.3 kg)    Physical Exam  Note examination was completely remotely via video observation objective data only  Gen - well-appearing, no acute distress or apparent pain, comfortable HEENT - eyes appear clear without discharge or redness Heart/Lungs - cannot examine virtually - observed brief episode of cough only Abd - cannot examine virtually  Skin - face visible today- no rash Neuro - awake, alert, oriented Psych - not anxious  appearing   Results for orders placed or performed in visit on 08/29/21  COMPLETE METABOLIC PANEL WITH GFR  Result Value Ref Range   Glucose, Bld 86 65 - 99 mg/dL   BUN 18 7 - 25 mg/dL   Creat 1.06 0.70 - 1.30 mg/dL   eGFR 81 > OR = 60 mL/min/1.27m   BUN/Creatinine Ratio NOT APPLICABLE 6 - 22 (calc)   Sodium 138 135 - 146 mmol/L   Potassium 4.2 3.5 - 5.3 mmol/L   Chloride 105 98 - 110 mmol/L   CO2 25 20 - 32 mmol/L   Calcium 9.9 8.6 - 10.3 mg/dL   Total Protein 6.6 6.1 - 8.1 g/dL   Albumin 4.2 3.6 - 5.1 g/dL   Globulin 2.4 1.9 - 3.7 g/dL (calc)   AG Ratio 1.8 1.0 - 2.5 (calc)   Total Bilirubin 1.1 0.2 - 1.2 mg/dL   Alkaline phosphatase (APISO) 71 35 - 144 U/L   AST 18 10 - 35 U/L   ALT 16 9 - 46 U/L  Hemoglobin A1c  Result Value Ref Range   Hgb A1c MFr Bld 5.2 <5.7 % of total Hgb   Mean Plasma Glucose 103 mg/dL   eAG (mmol/L) 5.7 mmol/L  Lipid panel  Result Value Ref Range   Cholesterol 195 <200 mg/dL   HDL 61 > OR = 40 mg/dL   Triglycerides 64 <150 mg/dL   LDL Cholesterol (Calc) 118 (H) mg/dL (calc)   Total CHOL/HDL Ratio 3.2 <5.0 (calc)   Non-HDL Cholesterol (Calc) 134 (H) <130 mg/dL (calc)  PSA  Result Value Ref Range   PSA 1.05 < OR = 4.00 ng/mL      Assessment & Plan:   Problem List Items Addressed This Visit   None Visit Diagnoses     Chronic elbow pain, right    -  Primary   Relevant Orders   Ambulatory referral to Orthopedic Surgery   Right lateral epicondylitis       Relevant Orders   Ambulatory referral to Orthopedic Surgery   Ulnar nerve entrapment at elbow, right       Relevant Orders   Ambulatory referral to Orthopedic Surgery   Recurrent dry cough           Unresolved R Elbow pain after last visit  chronic Right elbow pain 2+ months, without resolution, worse with repetitive strenuous activity heavy lifting, has been diagnosed with epicondylitis and treated conservatively without resolution, cannot rule out ulnar nerve entrapment based on  location and symptoms, no numbness or paresthesia. He has seen ortho before for hand trigger fingers. Will need further management for elbow pain   Referral to return to [facility] - Upper Ext Specialist Ethan FIran Planasin [place] #Dry Cough recurrent Residual resolving at this point no new concerns or treatment plan. Off antibiotics and cough  medicine, nearly resolved. Advised to give more time. Reconsider CXR in future if indicated.  Orders Placed This Encounter  Procedures   Ambulatory referral to Orthopedic Surgery    Referral Priority:   Routine    Referral Type:   Surgical    Referral Reason:   Specialty Services Required    Requested Specialty:   Orthopedic Surgery    Number of Visits Requested:   1     No orders of the defined types were placed in this encounter.     Follow up plan: Return if symptoms worsen or fail to improve.   Patient verbalizes understanding with the above medical recommendations including the limitation of remote medical advice.  Specific follow-up and call-back criteria were given for patient to follow-up or seek medical care more urgently if needed.  Total duration of direct patient care provided via video conference: 15 minutes   Nobie Putnam, [facility] 06/01/2022, 11:19 AM

## 2022-06-29 ENCOUNTER — Encounter: Payer: Self-pay | Admitting: Family Medicine

## 2022-07-07 ENCOUNTER — Ambulatory Visit: Payer: Self-pay

## 2022-07-14 ENCOUNTER — Ambulatory Visit: Payer: 59 | Admitting: Family Medicine

## 2022-07-18 ENCOUNTER — Other Ambulatory Visit: Payer: Self-pay | Admitting: Family Medicine

## 2022-07-20 ENCOUNTER — Encounter (INDEPENDENT_AMBULATORY_CARE_PROVIDER_SITE_OTHER): Payer: Self-pay

## 2022-07-20 NOTE — Telephone Encounter (Signed)
Requested Prescriptions  Pending Prescriptions Disp Refills  . SUMAtriptan (IMITREX) 20 MG/ACT nasal spray [Pharmacy Med Name: SUMATRIPTAN 20 MG NASAL SPRAY] 1 each 2    Sig: PLACE 1 SPRAY (20 MG TOTAL) INTO THE NOSE EVERY 2 (TWO) HOURS AS NEEDED FOR MIGRAINE OR HEADACHE. MAY REPEAT IN 2 HOURS IF HEADACHE PERSISTS OR RECURS.     Neurology:  Migraine Therapy - Triptan Passed - 07/18/2022  8:37 AM      Passed - Last BP in normal range    BP Readings from Last 1 Encounters:  05/15/22 119/70         Passed - Valid encounter within last 12 months    Recent Outpatient Visits          1 month ago Chronic elbow pain, right   [place], DO   3 months ago Right lateral epicondylitis   [place], DO   7 months ago Spermatic cord pain   [place], DO   10 months ago Benign prostatic hyperplasia with nocturia   [place], DO   1 year ago Primary osteoarthritis of both hands   [place], DO      Future Appointments            In 1 month Nehemiah Massed Monia Sabal, MD [place]

## 2022-07-28 ENCOUNTER — Other Ambulatory Visit: Payer: Self-pay | Admitting: Family Medicine

## 2022-07-28 DIAGNOSIS — F419 Anxiety disorder, unspecified: Secondary | ICD-10-CM

## 2022-07-28 NOTE — Telephone Encounter (Signed)
Unable to refill per protocol, Rx request is too soon, last refill 04/06/22 for 90 and 3 rf. Will refuse.  Requested Prescriptions  Pending Prescriptions Disp Refills   escitalopram (LEXAPRO) 20 MG tablet [Pharmacy Med Name: ESCITALOPRAM 20 MG TABLET] 90 tablet 3    Sig: TAKE 1 TABLET BY MOUTH EVERY DAY     Psychiatry:  Antidepressants - SSRI Passed - 07/28/2022 10:32 AM      Passed - Valid encounter within last 6 months    Recent Outpatient Visits           1 month ago Chronic elbow pain, right   [place], DO   3 months ago Right lateral epicondylitis   [place], DO   8 months ago Spermatic cord pain   [place], DO   11 months ago Benign prostatic hyperplasia with nocturia   [place], DO   1 year ago Primary osteoarthritis of both hands   [place], DO       Future Appointments             In 3 weeks Ralene Bathe, MD [place]

## 2022-08-11 ENCOUNTER — Encounter: Payer: Self-pay | Admitting: Family Medicine

## 2022-08-11 ENCOUNTER — Ambulatory Visit: Payer: 59 | Admitting: Family Medicine

## 2022-08-11 VITALS — BP 126/82 | HR 70 | Ht 74.0 in | Wt 203.0 lb

## 2022-08-11 DIAGNOSIS — F419 Anxiety disorder, unspecified: Secondary | ICD-10-CM | POA: Diagnosis not present

## 2022-08-11 DIAGNOSIS — F902 Attention-deficit hyperactivity disorder, combined type: Secondary | ICD-10-CM

## 2022-08-11 MED ORDER — BUPROPION HCL ER (XL) 150 MG PO TB24: 150.0000 mg | ORAL_TABLET | Freq: Every day | ORAL | 2 refills | Status: DC

## 2022-08-11 NOTE — Progress Notes (Signed)
Subjective:    Patient ID: Ethan Glenn, male    DOB: 1961/06/14, 61 y.o.   MRN: [id number]  Ethan Glenn is a 61 y.o. male presenting on 08/11/2022 for ADD and Anxiety   HPI  Anxiety ADHD  Several years ago, prior PCP in [facility], he was placed on Lexapro to help anxiety and help manage his ADD. He was diagnosed with ADHD. 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 He has tried improving his [employer] business, but he is impatient and lacks focus with this. - He admits more difficulty without  - Increased free time, and he loves to read, he feels he has difficulty finishing a book, due to distractions. - Admits eye starting to twitch again - he had seen a Neurologist several years ago, and he was not pleased with this, he did some testing and they considered medication Concerta. - He has used his coping strategies but feels it is less successful lately. - Longer he is retired, now he is having difficulty with focusing.  He had been on Lexapro 10-81m for period of time, has done well. Prior visit 01/2022 he had to increase dose Lexapro 10 to 221mdaily.  He does well on Lexapro for managing anxiety.  Previously on Concerta - but felt like speed. Did not take long, preferred off med.  Always feels like he needs to keep moving and physical activity. 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      08/11/2022    2:30 PM 06/01/2022   11:11 AM 04/10/2022    9:33 AM  Depression screen PHQ 2/9  Decreased Interest 2 0 0  Down, Depressed, Hopeless 0 0 0  PHQ - 2 Score 2 0 0  Altered sleeping _0 Tired, decreased energy 2 0 0  Change in appetite 0 1 1  Feeling bad or failure about yourself  0 0 0  Trouble concentrating 3 0 0  Moving slowly or fidgety/restless 3 0 0  Suicidal thoughts 0 0 0  PHQ-9 Score _1 Difficult doing work/chores Very difficult Somewhat  difficult Somewhat difficult      08/11/2022    2:30 PM 06/01/2022   11:11 AM 04/10/2022    9:33 AM 11/24/2021    9:25 AM  GAD 7 : Generalized Anxiety Score  Nervous, Anxious, on Edge 2 0 0 0  Control/stop worrying 2 0 0 0  Worry too much - different things 2 0 0 0  Trouble relaxing _2 0  Restless 3 0 0 0  Easily annoyed or irritable 3 0 0 0  Afraid - awful might happen 0 0 0 0  Total GAD 7 Score _3 0  Anxiety Difficulty Not difficult at all Not difficult at all Not difficult at all Not difficult at all     Social History   Tobacco Use   Smoking status: Former    Types: Cigarettes    Quit date: 08/07/1986    Years since quitting: 36.0   Smokeless tobacco: Never  Vaping Use   Vaping Use: Never used  Substance Use Topics   Alcohol use: Yes    Alcohol/week: 3.0 standard drinks of alcohol    Types: 3 Standard drinks or equivalent per week   Drug use: Never    Review of Systems Per HPI unless specifically indicated above  Objective:    BP 126/82   Pulse 70   Ht _0  (1.88 m)   Wt 203 lb (92.1 kg)   SpO2 99%   BMI 26.06 kg/m   Wt Readings from Last 3 Encounters:  08/11/22 203 lb (92.1 kg)  06/01/22 196 lb (88.9 kg)  04/10/22 196 lb 9.6 oz (89.2 kg)    Physical Exam Vitals and nursing note reviewed.  Constitutional:      General: He is not in acute distress.    Appearance: Normal appearance. He is well-developed. He is not diaphoretic.     Comments: Well-appearing, comfortable, cooperative  HENT:     Head: Normocephalic and atraumatic.  Eyes:     General:        Right eye: No discharge.        Left eye: No discharge.     Conjunctiva/sclera: Conjunctivae normal.  Cardiovascular:     Rate and Rhythm: Normal rate.  Pulmonary:     Effort: Pulmonary effort is normal.  Skin:    General: Skin is warm and dry.     Findings: No erythema or rash.  Neurological:     Mental Status: He is alert and oriented to person, place, and time.  Psychiatric:         Mood and Affect: Mood normal.        Behavior: Behavior normal.        Thought Content: Thought content normal.     Comments: Well groomed, good eye contact, normal speech and thoughts    Results for orders placed or performed in visit on 08/29/21  COMPLETE METABOLIC PANEL WITH GFR  Result Value Ref Range   Glucose, Bld 86 65 - 99 mg/dL   BUN 18 7 - 25 mg/dL   Creat 1.06 0.70 - 1.30 mg/dL   eGFR 81 > OR = 60 mL/min/1.41m   BUN/Creatinine Ratio NOT APPLICABLE 6 - 22 (calc)   Sodium 138 135 - 146 mmol/L   Potassium 4.2 3.5 - 5.3 mmol/L   Chloride 105 98 - 110 mmol/L   CO2 25 20 - 32 mmol/L   Calcium 9.9 8.6 - 10.3 mg/dL   Total Protein 6.6 6.1 - 8.1 g/dL   Albumin 4.2 3.6 - 5.1 g/dL   Globulin 2.4 1.9 - 3.7 g/dL (calc)   AG Ratio 1.8 1.0 - 2.5 (calc)   Total Bilirubin 1.1 0.2 - 1.2 mg/dL   Alkaline phosphatase (APISO) 71 35 - 144 U/L   AST 18 10 - 35 U/L   ALT 16 9 - 46 U/L  Hemoglobin A1c  Result Value Ref Range   Hgb A1c MFr Bld 5.2 <5.7 % of total Hgb   Mean Plasma Glucose 103 mg/dL   eAG (mmol/L) 5.7 mmol/L  Lipid panel  Result Value Ref Range   Cholesterol 195 <200 mg/dL   HDL 61 > OR = 40 mg/dL   Triglycerides 64 <150 mg/dL   LDL Cholesterol (Calc) 118 (H) mg/dL (calc)   Total CHOL/HDL Ratio 3.2 <5.0 (calc)   Non-HDL Cholesterol (Calc) 134 (H) <130 mg/dL (calc)  PSA  Result Value Ref Range   PSA 1.05 < OR = 4.00 ng/mL      Assessment & Plan:   Problem List Items Addressed This Visit     ADHD   Relevant Medications   buPROPion (WELLBUTRIN XL) 150 MG 24 hr tablet   Anxiety - Primary   Relevant Medications   buPROPion (WELLBUTRIN XL) 150 MG 24 hr tablet  Anxiety Comorbid ADHD component Chronic history related symptoms, has been self managed or treated over years, but not on significant ADHD medication. 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  Will proceed with mild option that can work with his Escitalopram and help manage his symptoms - Start Wellbutrin XL 155mdaily, for its approved use in ADHD for focus inattention - May adjust dose accordingly  Future consider next option Strattera non stimulant therapy, vs future stimulant therapy Future reconsider CBT / therapy referral. He prefers to avoid additional specialists at this time.  No orders of the defined types were placed in this encounter.    Meds ordered this encounter  Medications   buPROPion (WELLBUTRIN XL) 150 MG 24 hr tablet    Sig: Take 1 tablet (150 mg total) by mouth daily.    Dispense:  30 tablet    Refill:  2      Follow up plan: Return in about 6 weeks (around 09/22/2022) for 6 week follow-up Anxiety ADHD virtual visit follow-up.   AlNobie PutnamDO [facility] 08/11/2022, 2:36 PM

## 2022-08-11 NOTE — Patient Instructions (Addendum)
Thank you for coming to the office today.  Keep on the Escitalopram '20mg'$  daily  We will start the Wellbutrin XL '150mg'$  daily in morning, and it is a mood medication primarily that has been FDA approved for use in ADD ADHD for focus / inattention. Hopefully this combined with the Escitalopram/lexapro will be successful.  May take 3-4 weeks for full benefit.  Next option - we can add or swap with the NON STIMULANT option - Strattera (40-'80mg'$  dose).  Future Stimulant options can be viable options - on a more as needed basis   Please schedule a Follow-up Appointment to: Return in about 6 weeks (around 09/22/2022) for 6 week follow-up Anxiety ADHD virtual visit follow-up.  If you have any other questions or concerns, please feel free to call the office or send a message through [place]. You may also schedule an earlier appointment if necessary.  Additionally, you may be receiving a survey about your experience at our office within a few days to 1 week by e-mail or mail. We value your feedback.  Nobie Putnam, DO [place]

## 2022-08-16 ENCOUNTER — Encounter: Payer: Self-pay | Admitting: Family Medicine

## 2022-08-23 ENCOUNTER — Other Ambulatory Visit: Payer: Self-pay | Admitting: Internal Medicine

## 2022-08-23 DIAGNOSIS — N401 Enlarged prostate with lower urinary tract symptoms: Secondary | ICD-10-CM

## 2022-08-24 ENCOUNTER — Encounter: Payer: Self-pay | Admitting: Dermatology

## 2022-08-24 ENCOUNTER — Ambulatory Visit: Payer: 59 | Admitting: Dermatology

## 2022-08-24 VITALS — BP 106/67 | HR 67

## 2022-08-24 DIAGNOSIS — Z79899 Other long term (current) drug therapy: Secondary | ICD-10-CM

## 2022-08-24 DIAGNOSIS — L408 Other psoriasis: Secondary | ICD-10-CM

## 2022-08-24 DIAGNOSIS — L219 Seborrheic dermatitis, unspecified: Secondary | ICD-10-CM | POA: Diagnosis not present

## 2022-08-24 NOTE — Patient Instructions (Addendum)
Continue Ketoconazole 2% shampoo up to 4 days a week and Mometasone lotion up to 5 days a week as directed/as needed. If controlled stop mometasone.   Topical steroids (such as triamcinolone, fluocinolone, fluocinonide, mometasone, clobetasol, halobetasol, betamethasone, hydrocortisone) can cause thinning and lightening of the skin if they are used for too long in the same area. Your physician has selected the right strength medicine for your problem and area affected on the body. Please use your medication only as directed by your physician to prevent side effects.     Due to recent changes in healthcare laws, you may see results of your pathology and/or laboratory studies on MyChart before the doctors have had a chance to review them. We understand that in some cases there may be results that are confusing or concerning to you. Please understand that not all results are received at the same time and often the doctors may need to interpret multiple results in order to provide you with the best plan of care or course of treatment. Therefore, we ask that you please give [place] 2 business days to thoroughly review all your results before contacting the office for clarification. Should we see a critical lab result, you will be contacted sooner.   If You Need Anything After Your Visit  If you have any questions or concerns for your doctor, please call our main line at [phone] and press option 4 to reach your doctor's medical assistant. If no one answers, please leave a voicemail as directed and we will return your call as soon as possible. Messages left after 4 pm will be answered the following business day.   You may also send [place] a message via [place]. We typically respond to MyChart messages within 1-2 business days.  For prescription refills, please ask your pharmacy to contact our office. Our fax number is [phone].  If you have an urgent issue when the clinic is closed that cannot wait until  the next business day, you can page your doctor at the number below.    Please note that while we do our best to be available for urgent issues outside of office hours, we are not available 24/7.   If you have an urgent issue and are unable to reach [place], you may choose to seek medical care at your doctor's office, retail clinic, urgent care center, or emergency room.  If you have a medical emergency, please immediately call 911 or go to the emergency department.  Pager Numbers  - Dr. Nehemiah Massed: [phone]  - Dr. Laurence Ferrari: [phone]  - Dr. Nicole Kindred: [phone]  In the event of inclement weather, please call our main line at [phone] for an update on the status of any delays or closures.  Dermatology Medication Tips: Please keep the boxes that topical medications come in in order to help keep track of the instructions about where and how to use these. Pharmacies typically print the medication instructions only on the boxes and not directly on the medication tubes.   If your medication is too expensive, please contact our office at [phone] option 4 or send [place] a message through [place].   We are unable to tell what your co-pay for medications will be in advance as this is different depending on your insurance coverage. However, we may be able to find a substitute medication at lower cost or fill out paperwork to get insurance to cover a needed medication.   If a prior authorization is required to get your medication covered  by your insurance company, please allow [place] 1-2 business days to complete this process.  Drug prices often vary depending on where the prescription is filled and some pharmacies may offer cheaper prices.  The website www.goodrx.com contains coupons for medications through different pharmacies. The prices here do not account for what the cost may be with help from insurance (it may be cheaper with your insurance), but the website can give you the price if you did  not use any insurance.  - You can print the associated coupon and take it with your prescription to the pharmacy.  - You may also stop by our office during regular business hours and pick up a GoodRx coupon card.  - If you need your prescription sent electronically to a different pharmacy, notify our office through [facility] or by phone at [phone] option 4.     Si Usted Necesita Algo Despus de Su Visita  Tambin puede enviarnos un mensaje a travs de Pharmacist, community. Por lo general respondemos a los mensajes de MyChart en el transcurso de 1 a 2 das hbiles.  Para renovar recetas, por favor pida a su farmacia que se ponga en contacto con nuestra oficina. Harland Dingwall de fax es [place] [phone].  Si tiene un asunto urgente cuando la clnica est cerrada y que no puede esperar hasta el siguiente da hbil, puede llamar/localizar a su doctor(a) al nmero que aparece a continuacin.   Por favor, tenga en cuenta que aunque hacemos todo lo posible para estar disponibles para asuntos urgentes fuera del horario de [place], no estamos disponibles las 24 horas del da, los 7 das de la [place].   Si tiene un problema urgente y no puede comunicarse con nosotros, puede optar por buscar atencin mdica  en el consultorio de su doctor(a), en una clnica privada, en un centro de atencin urgente o en una sala de emergencias.  Si tiene Engineering geologist, por favor llame inmediatamente al 911 o vaya a la sala de emergencias.  Nmeros de bper  - Dr. Nehemiah Massed: [phone]  - Dra. Moye: [phone]  - Dra. Nicole Kindred: [phone]  En caso de inclemencias del [place], por favor llame a Johnsie Kindred principal al [phone] para una actualizacin sobre el [place] de cualquier retraso o cierre.  Consejos para la medicacin en dermatologa: Por favor, guarde las cajas en las que vienen los medicamentos de uso tpico para ayudarle a seguir las instrucciones sobre dnde y cmo usarlos. Las  farmacias generalmente imprimen las instrucciones del medicamento slo en las cajas y no directamente en los tubos del [place].   Si su medicamento es muy caro, por favor, pngase en contacto con Zigmund Daniel llamando al [phone] y presione la opcin 4 o envenos un mensaje a travs de Pharmacist, community.   No podemos decirle cul ser su copago por los medicamentos por adelantado ya que esto es diferente dependiendo de la cobertura de su seguro. Sin embargo, es posible que podamos encontrar un medicamento sustituto a Electrical engineer un formulario para que el seguro cubra el medicamento que se considera necesario.   Si se requiere una autorizacin previa para que su compaa de seguros [place] su medicamento, por favor permtanos de 1 a 2 das hbiles para completar este proceso.  Los precios de los medicamentos varan con frecuencia dependiendo del Environmental consultant de dnde se surte la receta y alguna farmacias pueden ofrecer precios ms baratos.  El sitio web www.goodrx.com tiene cupones para medicamentos de Airline pilot. Los precios aqu no  tienen en cuenta lo que podra costar con la ayuda del seguro (puede ser ms barato con su seguro), pero el sitio web puede darle el precio si no Field seismologist.  - Puede imprimir el cupn correspondiente y llevarlo con su receta a la farmacia.  - Tambin puede pasar por nuestra oficina durante el horario de atencin regular y Charity fundraiser una tarjeta de cupones de GoodRx.  - Si necesita que su receta se enve electrnicamente a una farmacia diferente, informe a nuestra oficina a travs de MyChart de [place] o por telfono llamando al [phone] y presione la opcin 4.

## 2022-08-24 NOTE — Telephone Encounter (Signed)
Requested Prescriptions  Pending Prescriptions Disp Refills   tamsulosin (FLOMAX) 0.4 MG CAPS capsule [Pharmacy Med Name: TAMSULOSIN HCL 0.4 MG CAPSULE] 90 capsule 0    Sig: TAKE 1 CAPSULE BY MOUTH EVERY DAY AFTER SUPPER     Urology: Alpha-Adrenergic Blocker Passed - 08/23/2022  7:57 AM      Passed - PSA in normal range and within 360 days    PSA  Date Value Ref Range Status  09/01/2021 1.05 < OR = 4.00 ng/mL Final    Comment:    The total PSA value from this assay system is  standardized against the WHO standard. The test  result will be approximately 20% lower when compared  to the equimolar-standardized total PSA (Beckman  Coulter). Comparison of serial PSA results should be  interpreted with this fact in mind. . This test was performed using the Siemens  chemiluminescent method. Values obtained from  different assay methods cannot be used interchangeably. PSA levels, regardless of value, should not be interpreted as absolute evidence of the presence or absence of disease.          Passed - Last BP in normal range    BP Readings from Last 1 Encounters:  08/24/22 106/67         Passed - Valid encounter within last 12 months    Recent Outpatient Visits           1 week ago [place], DO   2 months ago Chronic elbow pain, right   [place], DO   4 months ago Right lateral epicondylitis   [place], DO   9 months ago Spermatic cord pain   [place], DO   12 months ago Benign prostatic hyperplasia with nocturia   [place], DO       Future Appointments             In 1 week Parks Ranger, Devonne Doughty, DO [facility] [place], [place]   In 1 year Ralene Bathe, MD [place]

## 2022-08-24 NOTE — Progress Notes (Signed)
   Follow-Up Visit   Subjective  Ethan Glenn is a 61 y.o. male who presents for the following: Seborrheic Dermatitis (Scalp. Using Ketoconazole 2% shampoo and Mometasone lotion as directed. Has noticed improvement ).  The following portions of the chart were reviewed this encounter and updated as appropriate:  Tobacco  Allergies  Meds  Problems  Med Hx  Surg Hx  Fam Hx     Review of Systems: No other skin or systemic complaints except as noted in HPI or Assessment and Plan.  Objective  Well appearing patient in no apparent distress; mood and affect are within normal limits.  A focused examination was performed including head, including the scalp, face, neck, nose, ears, eyelids, and lips. Relevant physical exam findings are noted in the Assessment and Plan.  Scalp above ears Pink patches with greasy scale.    Assessment & Plan  Seborrheic dermatitis Scalp above ears Sebo-psoriasis Chronic and persistent condition with duration or expected duration over one year. Condition is symptomatic / bothersome to patient. Not to goal, but improved.  Continue Ketoconazole 2% shampoo 3 days per week.  May use other shampoos - recommend shampooing up to 7 days per week.  and  Mometasone lotion up to 4 days per week prn as directed/as needed.   Seborrheic Dermatitis  -  is a chronic persistent rash characterized by pinkness and scaling most commonly of the mid face but also can occur on the scalp (dandruff), ears; mid chest, mid back and groin.  It tends to be exacerbated by stress and cooler weather.  People who have neurologic disease may experience new onset or exacerbation of existing seborrheic dermatitis.  The condition is not curable but treatable and can be controlled.  Psoriasis is a chronic non-curable, but treatable genetic/hereditary disease that may have other systemic features affecting other organ systems such as joints (Psoriatic Arthritis). It is associated with an increased  risk of inflammatory bowel disease, heart disease, non-alcoholic fatty liver disease, and depression.    Return for TBSE As Scheduled with Dr. Laurence Ferrari.  I, Emelia Salisbury, CMA, am acting as scribe for Sarina Ser, MD. Documentation: I have reviewed the above documentation for accuracy and completeness, and I agree with the above.  Sarina Ser, MD

## 2022-08-27 ENCOUNTER — Encounter: Payer: Self-pay | Admitting: Family Medicine

## 2022-08-27 ENCOUNTER — Telehealth (INDEPENDENT_AMBULATORY_CARE_PROVIDER_SITE_OTHER): Payer: Self-pay | Admitting: Vascular Surgery

## 2022-08-27 NOTE — Telephone Encounter (Signed)
LVM for pt advising that his CT is due in December and I have received auth for it. I advised that he may call radiology scheduling and make the CT appt and call us back at the office to schedule a CT results appt with Dr. Delana Meyer.

## 2022-09-02 ENCOUNTER — Encounter: Payer: Self-pay | Admitting: Family Medicine

## 2022-09-02 ENCOUNTER — Telehealth (INDEPENDENT_AMBULATORY_CARE_PROVIDER_SITE_OTHER): Payer: 59 | Admitting: Family Medicine

## 2022-09-02 VITALS — Ht 74.0 in | Wt 197.0 lb

## 2022-09-02 DIAGNOSIS — F902 Attention-deficit hyperactivity disorder, combined type: Secondary | ICD-10-CM | POA: Diagnosis not present

## 2022-09-02 DIAGNOSIS — F419 Anxiety disorder, unspecified: Secondary | ICD-10-CM

## 2022-09-02 MED ORDER — ATOMOXETINE HCL 40 MG PO CAPS: 40.0000 mg | ORAL_CAPSULE | Freq: Every day | ORAL | 1 refills | Status: DC

## 2022-09-02 NOTE — Patient Instructions (Addendum)
Continue Escitalopram Lexapro Stop Wellbutrin - if you feel odd after stopping suddenly, you may taper it every other day for 1-2 weeks, but that may not be required.  Start Strattera generic '40mg'$  daily, may dose increase from 40 to '80mg'$  (or x 2 of the '40mg'$ ) if you are feeling benefit but not strong enough dose after 1-2 weeks. Let me know how it goes and we can follow-up, orders  Check Goodrx.com website for discounts for this.  Please schedule a Follow-up Appointment to: Return in about 4 weeks (around 09/30/2022) for anxiety ADHD.  If you have any other questions or concerns, please feel free to call the office or send a message through [place]. You may also schedule an earlier appointment if necessary.  Additionally, you may be receiving a survey about your experience at our office within a few days to 1 week by e-mail or mail. We value your feedback.  Nobie Putnam, DO [place]

## 2022-09-02 NOTE — Progress Notes (Signed)
Subjective:    Patient ID: Ethan Glenn, male    DOB: March 01, 1961, 61 y.o.   MRN: [id number]  Ethan Glenn is a 61 y.o. male presenting on 09/02/2022 for Anxiety  Virtual / Telehealth Encounter - Video Visit via MyChart The purpose of this virtual visit is to provide medical care while limiting exposure to the novel coronavirus (COVID19) for both patient and office staff.  Consent was obtained for remote visit:  Yes.   Answered questions that patient had about telehealth interaction:  Yes.   I discussed the limitations, risks, security and privacy concerns of performing an evaluation and management service by video/telephone. I also discussed with the patient that there may be a patient responsible charge related to this service. The patient expressed understanding and agreed to proceed.  Patient Location: Home Provider Location: [facility] (Office)  Participants in virtual visit: - Patient: Ethan Glenn - CMA: Orinda Kenner, CMA - Provider: Dr Parks Ranger  HPI  Anxiety, ADHD follow-up Last visit with me 08/11/22,   At first some mild side effect ear buzzing and getting settled on the medication on Wellbutrin XL '150mg'$  daily  Currently benefit is rated at a 1 out of 3, he feels some of the absent mindedness is marginally better, not a huge improvement  He does still feel some improvement with regards to being more "aware" of his symptoms but does not attribute this to the medication for sure.  He admits still focus and forgetfulness issues present.  He continues on Escitalopram '20mg'$  daily  History on Concerta briefly but came off ineffective. Has not been on other stimulant medication.      09/02/2022   11:36 AM 08/11/2022    2:30 PM 06/01/2022   11:11 AM  Depression screen PHQ 2/9  Decreased Interest 1 2 0  Down, Depressed, Hopeless 0 0 0  PHQ - 2 Score 1 2 0  Altered sleeping '2 2 2  '$ Tired, decreased energy 1 2 0  Change in appetite 1  0 1  Feeling bad or failure about yourself  0 0 0  Trouble concentrating 3 3 0  Moving slowly or fidgety/restless 2 3 0  Suicidal thoughts 0 0 0  PHQ-9 Score '10 12 3  '$ Difficult doing work/chores Somewhat difficult Very difficult Somewhat difficult    Social History   Tobacco Use   Smoking status: Former    Types: Cigarettes    Quit date: 08/07/1986    Years since quitting: 36.0   Smokeless tobacco: Never  Vaping Use   Vaping Use: Never used  Substance Use Topics   Alcohol use: Yes    Alcohol/week: 3.0 standard drinks of alcohol    Types: 3 Standard drinks or equivalent per week   Drug use: Never    Review of Systems Per HPI unless specifically indicated above     Objective:    Ht '6\' 2"'$  (1.88 m)   Wt 197 lb (89.4 kg)   BMI 25.29 kg/m   Wt Readings from Last 3 Encounters:  09/02/22 197 lb (89.4 kg)  08/11/22 203 lb (92.1 kg)  06/01/22 196 lb (88.9 kg)    Physical Exam  Note examination was completely remotely via video observation objective data only  Gen - well-appearing, no acute distress or apparent pain, comfortable HEENT - eyes appear clear without discharge or redness Heart/Lungs - cannot examine virtually - observed no evidence of coughing or labored breathing. Abd - cannot examine virtually  Skin - face  visible today- no rash Neuro - awake, alert, oriented Psych - not anxious appearing   Results for orders placed or performed in visit on 08/27/22  HM COLONOSCOPY  Result Value Ref Range   HM Colonoscopy See Report (in chart) See Report (in chart), Patient Reported      Assessment & Plan:   Problem List Items Addressed This Visit     ADHD   Relevant Medications   atomoxetine (STRATTERA) 40 MG capsule   Anxiety - Primary    Mild improvement on Wellbutrin for ADHD but not significant benefit  Continue Escitalopram Lexapro '20mg'$  daily since anxiety is controlled  Stop Wellbutrin - if you feel odd after stopping suddenly, you may taper it every  other day for 1-2 weeks, but that may not be required.  New rx Non stimulant ADHD med  Start Strattera generic '40mg'$  daily, may dose increase from 40 to '80mg'$  (or x 2 of the '40mg'$ ) if you are feeling benefit but not strong enough dose after 1-2 weeks. Let me know how it goes and we can follow-up, orders  Check Goodrx.com website for discounts for this.  Future reconsider stimulant AS NEEDED therapy   Meds ordered this encounter  Medications   atomoxetine (STRATTERA) 40 MG capsule    Sig: Take 1 capsule (40 mg total) by mouth daily.    Dispense:  30 capsule    Refill:  1      Follow up plan: Return in about 4 weeks (around 09/30/2022) for anxiety ADHD.  Patient verbalizes understanding with the above medical recommendations including the limitation of remote medical advice.  Specific follow-up and call-back criteria were given for patient to follow-up or seek medical care more urgently if needed.  Total duration of direct patient care provided via video conference: 15 minutes   Nobie Putnam, [facility] 09/02/2022, 11:51 AM

## 2022-09-06 ENCOUNTER — Encounter: Payer: Self-pay | Admitting: Dermatology

## 2022-09-08 ENCOUNTER — Ambulatory Visit: Payer: 59

## 2022-09-08 ENCOUNTER — Ambulatory Visit
Admission: RE | Admit: 2022-09-08 | Discharge: 2022-09-08 | Disposition: A | Discharge status: Home / Self Care | Payer: 59 | Source: Ambulatory Visit | Attending: Vascular Surgery | Admitting: Vascular Surgery

## 2022-09-08 DIAGNOSIS — I7121 Aneurysm of the ascending aorta, without rupture: Secondary | ICD-10-CM | POA: Diagnosis present

## 2022-09-08 MED ORDER — IOHEXOL 350 MG/ML SOLN
75.0000 mL | Freq: Once | INTRAVENOUS | Status: AC | PRN
  Administered 2022-09-08: 75 mL via INTRAVENOUS

## 2022-09-17 ENCOUNTER — Encounter: Payer: Self-pay | Admitting: Family Medicine

## 2022-09-17 DIAGNOSIS — F902 Attention-deficit hyperactivity disorder, combined type: Secondary | ICD-10-CM

## 2022-09-17 MED ORDER — CONCERTA 18 MG PO TBCR: 18.0000 mg | EXTENDED_RELEASE_TABLET | Freq: Every day | ORAL | 0 refills | Status: DC

## 2022-09-22 ENCOUNTER — Encounter: Payer: Self-pay | Admitting: Family Medicine

## 2022-09-22 DIAGNOSIS — F902 Attention-deficit hyperactivity disorder, combined type: Secondary | ICD-10-CM

## 2022-09-23 MED ORDER — CONCERTA 27 MG PO TBCR: 27.0000 mg | EXTENDED_RELEASE_TABLET | ORAL | 0 refills | Status: DC

## 2022-09-23 NOTE — Addendum Note (Signed)
Addended by: Olin Hauser on: 09/23/2022 06:50 PM   Modules accepted: Orders

## 2022-09-27 NOTE — Progress Notes (Signed)
MRN : [id number]  Ethan Glenn is a 62 y.o. (25-Nov-1960) male who presents with chief complaint of check circulation.  History of Present Illness:  The patient presents to the office for evaluation of an ascending thoracic aortic aneurysm. The aneurysm was found incidentally by CT scan. Patient denies chest pain or unusual back pain, no other chest or abdominal complaints.  No history of an abrupt onset of a painful toe associated with blue discoloration.     No family history of TAA/AAA.   Patient denies amaurosis fugax or TIA symptoms.  There is no history of claudication or rest pain symptoms of the lower extremities.   The patient denies angina or shortness of breath.  CT scan dated 09/08/2022, shows an ascending TAA that measures 4.1 cm.  Interpretation:  Ectatic ascending thoracic aorta, 4.1 cm. Otherwise unremarkable study.   No outpatient medications have been marked as taking for the 09/28/22 encounter (Appointment) with Delana Meyer, Dolores Lory, MD.    Past Medical History:  Diagnosis Date   ADHD    Allergic rhinitis    Anxiety    Frequent headaches    Migraine     Past Surgical History:  Procedure Laterality Date   HERNIA REPAIR  4098   umbilical   KNEE SURGERY Left 1984   knee arthroscopic meniscectomy    Social History Social History   Tobacco Use   Smoking status: Former    Types: Cigarettes    Quit date: 08/07/1986    Years since quitting: 36.1   Smokeless tobacco: Never  Vaping Use   Vaping Use: Never used  Substance Use Topics   Alcohol use: Yes    Alcohol/week: 3.0 standard drinks of alcohol    Types: 3 Standard drinks or equivalent per week   Drug use: Never    Family History Family History  Problem Relation Age of Onset   Glaucoma Mother    Ovarian cancer Mother    Hypertension Father    Stroke Father    Anxiety disorder Sister     Allergies  Allergen Reactions   Erythromycin Nausea Only   Quinolones Other (See  Comments)    Aortic Aneurysm - should avoid Fluoroquinolone medications.     REVIEW OF SYSTEMS (Negative unless checked)  Constitutional: '[]'$ Weight loss  '[]'$ Fever  '[]'$ Chills Cardiac: '[]'$ Chest pain   '[]'$ Chest pressure   '[]'$ Palpitations   '[]'$ Shortness of breath when laying flat   '[]'$ Shortness of breath with exertion. Vascular:  '[x]'$ Pain in legs with walking   '[]'$ Pain in legs at rest  '[]'$ History of DVT   '[]'$ Phlebitis   '[]'$ Swelling in legs   '[]'$ Varicose veins   '[]'$ Non-healing ulcers Pulmonary:   '[]'$ Uses home oxygen   '[]'$ Productive cough   '[]'$ Hemoptysis   '[]'$ Wheeze  '[]'$ COPD   '[]'$ Asthma Neurologic:  '[]'$ Dizziness   '[]'$ Seizures   '[]'$ History of stroke   '[]'$ History of TIA  '[]'$ Aphasia   '[]'$ Vissual changes   '[]'$ Weakness or numbness in arm   '[]'$ Weakness or numbness in leg Musculoskeletal:   '[]'$ Joint swelling   '[]'$ Joint pain   '[]'$ Low back pain Hematologic:  '[]'$ Easy bruising  '[]'$ Easy bleeding   '[]'$ Hypercoagulable state   '[]'$ Anemic Gastrointestinal:  '[]'$ Diarrhea   '[]'$ Vomiting  '[]'$ Gastroesophageal reflux/heartburn   '[]'$ Difficulty swallowing. Genitourinary:  '[]'$ Chronic kidney disease   '[]'$ Difficult urination  '[]'$ Frequent urination   '[]'$ Blood in urine Skin:  '[]'$ Rashes   '[]'$ Ulcers  Psychological:  '[]'$ History of anxiety   '[]'$  History  of major depression.  Physical Examination  There were no vitals filed for this visit. There is no height or weight on file to calculate BMI. Gen: WD/WN, NAD Head: [place]/AT, No temporalis wasting.  Ear/Nose/Throat: Hearing grossly intact, nares w/o erythema or drainage Eyes: PER, EOMI, sclera nonicteric.  Neck: Supple, no masses.  No bruit or JVD.  Pulmonary:  Good air movement, no audible wheezing, no use of accessory muscles.  Cardiac: RRR, normal S1, S2, no Murmurs. Vascular:  mild trophic changes, no open wounds Vessel Right Left  Radial Palpable Palpable  PT Not Palpable Not Palpable  DP Not Palpable Not Palpable  Gastrointestinal: soft, non-distended. No guarding/no peritoneal signs.  Musculoskeletal: M/S 5/5  throughout.  No visible deformity.  Neurologic: CN 2-12 intact. Pain and light touch intact in extremities.  Symmetrical.  Speech is fluent. Motor exam as listed above. Psychiatric: Judgment intact, Mood & affect appropriate for pt's clinical situation. Dermatologic: No rashes or ulcers noted.  No changes consistent with cellulitis.   CBC Lab Results  Component Value Date   WBC 5.3 08/07/2020   HGB 14.8 08/07/2020   HCT 45.6 08/07/2020   MCV 88.9 08/07/2020   PLT 357 08/07/2020    BMET    Component Value Date/Time   NA 138 09/01/2021 0809   K 4.2 09/01/2021 0809   CL 105 09/01/2021 0809   CO2 25 09/01/2021 0809   GLUCOSE 86 09/01/2021 0809   BUN 18 09/01/2021 0809   CREATININE 1.06 09/01/2021 0809   CALCIUM 9.9 09/01/2021 0809   GFRNONAA 78 08/07/2020 1109   GFRAA 90 08/07/2020 1109   CrCl cannot be calculated (Patient's most recent lab result is older than the maximum 21 days allowed.).  COAG No results found for: "INR", "PROTIME"  Radiology CT ANGIO CHEST AORTA W/CM & OR WO/CM  Result Date: 09/08/2022 CLINICAL DATA:  Thoracic aortic aneurysm EXAM: CT ANGIOGRAPHY CHEST WITH CONTRAST TECHNIQUE: Multidetector CT imaging of the chest was performed using the standard protocol during bolus administration of intravenous contrast. Multiplanar CT image reconstructions and MIPs were obtained to evaluate the vascular anatomy. RADIATION DOSE REDUCTION: This exam was performed according to the departmental dose-optimization program which includes automated exposure control, adjustment of the mA and/or kV according to patient size and/or use of iterative reconstruction technique. CONTRAST:  75 mL OMNIPAQUE IOHEXOL 350 MG/ML SOLN COMPARISON:  None Available. FINDINGS: Cardiovascular: Preferential opacification of the thoracic aorta. Ascending thoracic aorta is ectatic with a maximum diameter of 4.1 cm. No dissection. Normal heart size. No pericardial effusion. Mediastinum/Nodes: No enlarged  mediastinal, hilar, or axillary lymph nodes. Thyroid gland, trachea, and esophagus demonstrate no significant findings. Lungs/Pleura: Lungs are clear. No pleural effusion or pneumothorax. Upper Abdomen: No acute abnormality. Musculoskeletal: No chest wall abnormality. No acute or significant osseous findings. Review of the MIP images confirms the above findings. IMPRESSION: Ectatic ascending thoracic aorta, 4.1 cm. Otherwise unremarkable study. Electronically Signed   By: Sammie Bench M.D.   On: 09/08/2022 11:22     Assessment/Plan 1. Aneurysm of ascending aorta without rupture (HCC) Recommend:  No surgery or intervention is indicated at this time.  The patient has an asymptomatic thoracic aortic aneurysm that is less than 6.0 cm in maximal diameter.  I have discussed the natural history of thoracic aortic aneurysm and the small risk of rupture for aneurysm less than 6.5 cm in size.  However, as these small aneurysms tend to enlarge over time, continued surveillance with CT scan is mandatory.  I have also discussed optimizing medical management with hypertension and lipid control and the importance of abstinence from tobacco.  The patient is also encouraged to exercise a minimum of 30 minutes 4 times a week.   Should the patient develop new onset chest or back pain or signs of peripheral embolization they are instructed to seek medical attention immediately and to alert the physician providing care that they have an aneurysm in the chest.   The patient voices their understanding.  The patient will return as ordered with a CT scan of the chest in two years  2. Primary osteoarthritis involving multiple joints Continue NSAID medications as already ordered, these medications have been reviewed and there are no changes at this time.  Continued activity and therapy was stressed.  3. Pure hypercholesterolemia Continue statin as ordered and reviewed, no changes at this time    Hortencia Pilar, MD  09/27/2022 12:00 PM

## 2022-09-28 ENCOUNTER — Ambulatory Visit (INDEPENDENT_AMBULATORY_CARE_PROVIDER_SITE_OTHER): Payer: 59 | Admitting: Vascular Surgery

## 2022-09-28 ENCOUNTER — Encounter (INDEPENDENT_AMBULATORY_CARE_PROVIDER_SITE_OTHER): Payer: Self-pay | Admitting: Vascular Surgery

## 2022-09-28 VITALS — BP 121/71 | HR 80 | Resp 16 | Ht 74.0 in | Wt 201.0 lb

## 2022-09-28 DIAGNOSIS — I7121 Aneurysm of the ascending aorta, without rupture: Secondary | ICD-10-CM

## 2022-09-28 DIAGNOSIS — M159 Polyosteoarthritis, unspecified: Secondary | ICD-10-CM | POA: Diagnosis not present

## 2022-09-28 DIAGNOSIS — E7800 Pure hypercholesterolemia, unspecified: Secondary | ICD-10-CM | POA: Diagnosis not present

## 2022-10-02 ENCOUNTER — Encounter (INDEPENDENT_AMBULATORY_CARE_PROVIDER_SITE_OTHER): Payer: Self-pay | Admitting: Vascular Surgery

## 2022-10-12 ENCOUNTER — Encounter: Payer: Self-pay | Admitting: Family Medicine

## 2022-10-13 ENCOUNTER — Other Ambulatory Visit: Payer: Self-pay | Admitting: Orthopedic Surgery

## 2022-10-13 DIAGNOSIS — M25561 Pain in right knee: Secondary | ICD-10-CM

## 2022-10-14 ENCOUNTER — Other Ambulatory Visit: Payer: Self-pay | Admitting: Family Medicine

## 2022-10-14 DIAGNOSIS — N401 Enlarged prostate with lower urinary tract symptoms: Secondary | ICD-10-CM

## 2022-10-14 DIAGNOSIS — F419 Anxiety disorder, unspecified: Secondary | ICD-10-CM

## 2022-10-14 MED ORDER — ESCITALOPRAM OXALATE 20 MG PO TABS: 20.0000 mg | ORAL_TABLET | Freq: Every day | ORAL | 3 refills | Status: DC

## 2022-10-14 MED ORDER — TAMSULOSIN HCL 0.4 MG PO CAPS
ORAL_CAPSULE | ORAL | 3 refills | Status: DC
Start: 2022-10-14 — End: 2023-03-09

## 2022-10-15 MED ORDER — CONCERTA 27 MG PO TBCR: 27.0000 mg | EXTENDED_RELEASE_TABLET | ORAL | 0 refills | Status: DC

## 2022-10-15 NOTE — Addendum Note (Signed)
Addended by: Olin Hauser on: 10/15/2022 12:13 PM   Modules accepted: Orders

## 2022-10-22 ENCOUNTER — Ambulatory Visit
Admission: RE | Admit: 2022-10-22 | Discharge: 2022-10-22 | Disposition: A | Discharge status: Home / Self Care | Payer: 59 | Source: Ambulatory Visit | Attending: Orthopedic Surgery | Admitting: Orthopedic Surgery

## 2022-10-22 DIAGNOSIS — M25561 Pain in right knee: Secondary | ICD-10-CM

## 2022-11-05 MED ORDER — CONCERTA 18 MG PO TBCR: 18.0000 mg | EXTENDED_RELEASE_TABLET | Freq: Every day | ORAL | 0 refills | Status: DC

## 2022-11-05 NOTE — Addendum Note (Signed)
Addended by: Olin Hauser on: 11/05/2022 01:11 PM   Modules accepted: Orders

## 2022-11-12 ENCOUNTER — Ambulatory Visit: Payer: 59 | Admitting: Dermatology

## 2022-11-12 ENCOUNTER — Encounter: Payer: Self-pay | Admitting: Dermatology

## 2022-11-12 VITALS — BP 114/76 | HR 76

## 2022-11-12 DIAGNOSIS — L570 Actinic keratosis: Secondary | ICD-10-CM

## 2022-11-12 DIAGNOSIS — L821 Other seborrheic keratosis: Secondary | ICD-10-CM

## 2022-11-12 DIAGNOSIS — L578 Other skin changes due to chronic exposure to nonionizing radiation: Secondary | ICD-10-CM

## 2022-11-12 DIAGNOSIS — D229 Melanocytic nevi, unspecified: Secondary | ICD-10-CM

## 2022-11-12 DIAGNOSIS — D2371 Other benign neoplasm of skin of right lower limb, including hip: Secondary | ICD-10-CM

## 2022-11-12 DIAGNOSIS — Z1283 Encounter for screening for malignant neoplasm of skin: Secondary | ICD-10-CM

## 2022-11-12 DIAGNOSIS — B070 Plantar wart: Secondary | ICD-10-CM

## 2022-11-12 DIAGNOSIS — L814 Other melanin hyperpigmentation: Secondary | ICD-10-CM

## 2022-11-12 MED ORDER — FLUOROURACIL 5 % EX CREA: TOPICAL_CREAM | CUTANEOUS | 1 refills | Status: DC

## 2022-11-12 MED ORDER — IMIQUIMOD 5 % EX CREA: TOPICAL_CREAM | CUTANEOUS | 2 refills | Status: DC

## 2022-11-12 NOTE — Progress Notes (Signed)
Follow-Up Visit   Subjective  Ethan Glenn is a 62 y.o. male who presents for the following: Annual Exam (No personal Hx of skin cancer or dysplastic nevi) and Warts (Plantar feet. Has been using 5FU/Salicylic acid paste, from [facility], as directed. Have not resolved).  The patient presents for Total-Body Skin Exam (TBSE) for skin cancer screening and mole check.  The patient has spots, moles and lesions to be evaluated, some may be new or changing and the patient has concerns that these could be cancer.  The following portions of the chart were reviewed this encounter and updated as appropriate:  Tobacco  Allergies  Meds  Problems  Med Hx  Surg Hx  Fam Hx      Review of Systems: No other skin or systemic complaints except as noted in HPI or Assessment and Plan.   Objective  Well appearing patient in no apparent distress; mood and affect are within normal limits.  A full examination was performed including scalp, head, eyes, ears, nose, lips, neck, chest, axillae, abdomen, back, buttocks, bilateral upper extremities, bilateral lower extremities, hands, feet, fingers, toes, fingernails, and toenails. All findings within normal limits unless otherwise noted below.  Plantar Feet Thin verrucous papule at left plantar foot. 2 small verrucous papules at left and right plantar foot.  Head - Anterior (Face) Erythematous thin papules/macules with gritty scale.    Assessment & Plan  Plantar wart Plantar Feet  Viral Wart (HPV) Counseling  Discussed viral / HPV (Human Papilloma Virus) etiology and risk of spread /infectivity to other areas of body as well as to other people.  Multiple treatments and methods may be required to clear warts and it is possible treatment may not be successful.  Treatment risks include discoloration; scarring and there is still potential for wart recurrence.  Patient defers LN2, Cantharone Plus and squaric acid today. 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  Continue prescription 5-fluorouracil/salicylic acid wart paste from [facility] nightly under occlusion. Reviewed risk of irritation and risk scarring if applied to normal skin. If irritation develops, stop medication for a few days until area calm, then restart a very small amount just to the wart. This medication cannot be used by pregnant women. Patient advised they will receive an email from the [facility] and can purchase the medication online through a link in the email.   Start Imiquimod cream every other night, alternating with wart paste, if doing well not irritated can do wart paste every night under duct tape occlusion and imiquimod every morning.   Reviewed expected reaction when using imiquimod cream, including irritation and mild inflammation and risk of erosions or more severe inflammation. Reviewed not to apply this in an area larger than 4 x 4 inches to avoid flu-like symptoms. Only a thin layer is required. Reviewed if too much irritation occurs, ensure application of only a thin layer and decrease frequency slightly to achieve a tolerable level of inflammation.    imiquimod (ALDARA) 5 % cream - Plantar Feet Apply every other night to affected areas on feet  AK (actinic keratosis) Head - Anterior (Face)  Actinic keratoses are precancerous spots that appear secondary to cumulative UV radiation exposure/sun exposure over time. They are chronic with expected duration over 1 year. A portion of actinic keratoses will progress to squamous cell carcinoma of the skin. It is not possible to reliably predict which spots will progress to skin cancer and so treatment is recommended  to prevent development of skin cancer.  Recommend daily broad spectrum sunscreen SPF 30+ to sun-exposed areas, reapply every 2 hours as needed.  Recommend staying in the shade or wearing long sleeves, sun glasses (UVA+UVB protection) and wide brim hats (4-inch brim around  the entire circumference of the hat). Call for new or changing lesions.  Start Fluorouracil 5% cream twice daily for 1 week to right side of nose near tip, left cheek, left temple, top of nose above tip  Reviewed course of treatment and expected reaction.  Patient advised to expect inflammation and crusting and advised that erosions are possible.  Patient advised to be diligent with sun protection during and after treatment. Counseled to keep medication out of reach of children and pets.   fluorouracil (EFUDEX) 5 % cream - Head - Anterior (Face) Apply twice daily to affected areas on face for 7 days   Lentigines - Scattered tan macules - Due to sun exposure - Benign-appearing, observe - Recommend daily broad spectrum sunscreen SPF 30+ to sun-exposed areas, reapply every 2 hours as needed. - Call for any changes  Seborrheic Keratoses - Stuck-on, waxy, tan-brown papules and/or plaques  - Benign-appearing - Discussed benign etiology and prognosis. - Observe - Call for any changes  Melanocytic Nevi - Tan-brown and/or pink-flesh-colored symmetric macules and papules - Benign appearing on exam today - Observation - Call clinic for new or changing moles - Recommend daily use of broad spectrum spf 30+ sunscreen to sun-exposed areas.   Hemangiomas - Red papules - Discussed benign nature - Observe - Call for any changes  Actinic Damage - chronic, secondary to cumulative UV radiation exposure/sun exposure over time - diffuse scaly erythematous macules with underlying dyspigmentation - Recommend daily broad spectrum sunscreen SPF 30+ to sun-exposed areas, reapply every 2 hours as needed.  - Recommend staying in the shade or wearing long sleeves, sun glasses (UVA+UVB protection) and wide brim hats (4-inch brim around the entire circumference of the hat). - Call for new or changing lesions.  Skin cancer screening performed today.   Dermatofibroma. Right thigh. - Firm pink/brown  papulenodule with dimple sign - Benign appearing - Call for any changes   Return in about 1 year (around 11/13/2023) for TBSE, [place] Follow Up in 2-4 months.  I, Emelia Salisbury, CMA, am acting as scribe for Forest Gleason, MD.  Documentation: I have reviewed the above documentation for accuracy and completeness, and I agree with the above.  Forest Gleason, MD

## 2022-11-12 NOTE — Patient Instructions (Addendum)
Warts on Feet: Start Imiquimod cream every other night, alternating with wart paste, if doing well and not irritated can do wart past every night under occlusion and imiquimod every morning.    Face Start Fluorouracil 5% cream twice daily for 1 week to right side of nose near tip, left cheek, left temple, top of nose above tip  Reviewed course of treatment and expected reaction.  Patient advised to expect inflammation and crusting and advised that erosions are possible.  Patient advised to be diligent with sun protection during and after treatment. Counseled to keep medication out of reach of children and pets.    Your prescription was sent to [facility] in [place]. A representative from [employer] will contact you within 2 business hours to verify your address and insurance information to schedule a free delivery. If for any reason you do not receive a phone call from them, please reach out to them. Their phone number is [phone] and their hours are Monday-Friday 9:00 am-5:00 pm.        Recommend taking Heliocare sun protection supplement daily in sunny weather for additional sun protection. For maximum protection on the sunniest days, you can take up to 2 capsules of regular Heliocare OR take 1 capsule of Heliocare Ultra. For prolonged exposure (such as a full day in the sun), you can repeat your dose of the supplement 4 hours after your first dose. Heliocare can be purchased at [employer], at some [facility] or at [email or web address].     Recommend daily broad spectrum sunscreen SPF 30+ to sun-exposed areas, reapply every 2 hours as needed. Call for new or changing lesions.  Staying in the shade or wearing long sleeves, sun glasses (UVA+UVB protection) and wide brim hats (4-inch brim around the entire circumference of the hat) are also recommended for sun protection.    Melanoma ABCDEs  Melanoma is the most dangerous type of skin cancer, and is the leading cause of  death from skin disease.  You are more likely to develop melanoma if you: Have light-colored skin, light-colored eyes, or red or blond hair Spend a lot of time in the sun Tan regularly, either outdoors or in a tanning bed Have had blistering sunburns, especially during childhood Have a close family member who has had a melanoma Have atypical moles or large birthmarks  Early detection of melanoma is key since treatment is typically straightforward and cure rates are extremely high if we catch it early.   The first sign of melanoma is often a change in a mole or a new dark spot.  The ABCDE system is a way of remembering the signs of melanoma.  A for asymmetry:  The two halves do not match. B for border:  The edges of the growth are irregular. C for color:  A mixture of colors are present instead of an even brown color. D for diameter:  Melanomas are usually (but not always) greater than 11m - the size of a pencil eraser. E for evolution:  The spot keeps changing in size, shape, and color.  Please check your skin once per month between visits. You can use a small mirror in front and a large mirror behind you to keep an eye on the back side or your body.   If you see any new or changing lesions before your next follow-up, please call to schedule a visit.  Please continue daily skin protection including broad spectrum sunscreen SPF 30+ to sun-exposed areas, reapplying every 2  hours as needed when you're outdoors.   Staying in the shade or wearing long sleeves, sun glasses (UVA+UVB protection) and wide brim hats (4-inch brim around the entire circumference of the hat) are also recommended for sun protection.    Due to recent changes in healthcare laws, you may see results of your pathology and/or laboratory studies on MyChart before the doctors have had a chance to review them. We understand that in some cases there may be results that are confusing or concerning to you. Please understand that  not all results are received at the same time and often the doctors may need to interpret multiple results in order to provide you with the best plan of care or course of treatment. Therefore, we ask that you please give [place] 2 business days to thoroughly review all your results before contacting the office for clarification. Should we see a critical lab result, you will be contacted sooner.   If You Need Anything After Your Visit  If you have any questions or concerns for your doctor, please call our main line at [phone] and press option 4 to reach your doctor's medical assistant. If no one answers, please leave a voicemail as directed and we will return your call as soon as possible. Messages left after 4 pm will be answered the following business day.   You may also send [place] a message via [place]. We typically respond to MyChart messages within 1-2 business days.  For prescription refills, please ask your pharmacy to contact our office. Our fax number is [phone].  If you have an urgent issue when the clinic is closed that cannot wait until the next business day, you can page your doctor at the number below.    Please note that while we do our best to be available for urgent issues outside of office hours, we are not available 24/7.   If you have an urgent issue and are unable to reach [place], you may choose to seek medical care at your doctor's office, retail clinic, urgent care center, or emergency room.  If you have a medical emergency, please immediately call 911 or go to the emergency department.  Pager Numbers  - Dr. Nehemiah Massed: [phone]  - Dr. Laurence Ferrari: [phone]  - Dr. Nicole Kindred: [phone]  In the event of inclement weather, please call our main line at [phone] for an update on the status of any delays or closures.  Dermatology Medication Tips: Please keep the boxes that topical medications come in in order to help keep track of the instructions about where and how  to use these. Pharmacies typically print the medication instructions only on the boxes and not directly on the medication tubes.   If your medication is too expensive, please contact our office at [phone] option 4 or send [place] a message through [place].   We are unable to tell what your co-pay for medications will be in advance as this is different depending on your insurance coverage. However, we may be able to find a substitute medication at lower cost or fill out paperwork to get insurance to cover a needed medication.   If a prior authorization is required to get your medication covered by your insurance company, please allow [place] 1-2 business days to complete this process.  Drug prices often vary depending on where the prescription is filled and some pharmacies may offer cheaper prices.  The website www.goodrx.com contains coupons for medications through different pharmacies. The prices here do not account for  what the cost may be with help from insurance (it may be cheaper with your insurance), but the website can give you the price if you did not use any insurance.  - You can print the associated coupon and take it with your prescription to the pharmacy.  - You may also stop by our office during regular business hours and pick up a GoodRx coupon card.  - If you need your prescription sent electronically to a different pharmacy, notify our office through [facility] or by phone at [phone] option 4.     Si Usted Necesita Algo Despus de Su Visita  Tambin puede enviarnos un mensaje a travs de Pharmacist, community. Por lo general respondemos a los mensajes de MyChart en el transcurso de 1 a 2 das hbiles.  Para renovar recetas, por favor pida a su farmacia que se ponga en contacto con nuestra oficina. Harland Dingwall de fax es [place] [phone].  Si tiene un asunto urgente cuando la clnica est cerrada y que no puede esperar hasta el siguiente da hbil, puede llamar/localizar a su  doctor(a) al nmero que aparece a continuacin.   Por favor, tenga en cuenta que aunque hacemos todo lo posible para estar disponibles para asuntos urgentes fuera del horario de [place], no estamos disponibles las 24 horas del da, los 7 das de la [place].   Si tiene un problema urgente y no puede comunicarse con nosotros, puede optar por buscar atencin mdica  en el consultorio de su doctor(a), en una clnica privada, en un centro de atencin urgente o en una sala de emergencias.  Si tiene Engineering geologist, por favor llame inmediatamente al 911 o vaya a la sala de emergencias.  Nmeros de bper  - Dr. Nehemiah Massed: [phone]  - Dra. Moye: [phone]  - Dra. Nicole Kindred: [phone]  En caso de inclemencias del [place], por favor llame a Johnsie Kindred principal al [phone] para una actualizacin sobre el [place] de cualquier retraso o cierre.  Consejos para la medicacin en dermatologa: Por favor, guarde las cajas en las que vienen los medicamentos de uso tpico para ayudarle a seguir las instrucciones sobre dnde y cmo usarlos. Las farmacias generalmente imprimen las instrucciones del medicamento slo en las cajas y no directamente en los tubos del [place].   Si su medicamento es muy caro, por favor, pngase en contacto con Zigmund Daniel llamando al [phone] y presione la opcin 4 o envenos un mensaje a travs de Pharmacist, community.   No podemos decirle cul ser su copago por los medicamentos por adelantado ya que esto es diferente dependiendo de la [place] de su seguro. Sin embargo, es posible que podamos encontrar un medicamento sustituto a Electrical engineer un formulario para que el seguro cubra el medicamento que se considera necesario.   Si se requiere una autorizacin previa para que su compaa de seguros [place] su medicamento, por favor permtanos de 1 a 2 das hbiles para completar este proceso.  Los precios de los medicamentos varan con frecuencia dependiendo del  Environmental consultant de dnde se surte la receta y alguna farmacias pueden ofrecer precios ms baratos.  El sitio web www.goodrx.com tiene cupones para medicamentos de Airline pilot. Los precios aqu no tienen en cuenta lo que podra costar con la ayuda del seguro (puede ser ms barato con su seguro), pero el sitio web puede darle el precio si no utiliz Research scientist (physical sciences).  - Puede imprimir el cupn correspondiente y llevarlo con su receta a la farmacia.  - Tambin puede  pasar por nuestra oficina durante el horario de atencin regular y Charity fundraiser una tarjeta de cupones de GoodRx.  - Si necesita que su receta se enve electrnicamente a una farmacia diferente, informe a nuestra oficina a travs de MyChart de  o por telfono llamando al [phone] y presione la opcin 4.

## 2022-11-13 ENCOUNTER — Encounter: Payer: Self-pay | Admitting: Dermatology

## 2022-11-17 ENCOUNTER — Encounter: Payer: Self-pay | Admitting: Family Medicine

## 2022-11-18 ENCOUNTER — Other Ambulatory Visit: Payer: Self-pay | Admitting: Family Medicine

## 2022-11-18 DIAGNOSIS — N401 Enlarged prostate with lower urinary tract symptoms: Secondary | ICD-10-CM

## 2022-11-18 NOTE — Telephone Encounter (Signed)
Unable to refill per protocol, Rx request is too soon. Last refill 10/14/22 for 90 and 3 refills.  Requested Prescriptions  Pending Prescriptions Disp Refills   tamsulosin (FLOMAX) 0.4 MG CAPS capsule [Pharmacy Med Name: TAMSULOSIN HCL 0.4 MG CAPSULE] 90 capsule 3    Sig: TAKE 1 CAPSULE BY MOUTH EVERY DAY AFTER SUPPER     Urology: Alpha-Adrenergic Blocker Failed - 11/18/2022  2:35 AM      Failed - PSA in normal range and within 360 days    PSA  Date Value Ref Range Status  09/01/2021 1.05 < OR = 4.00 ng/mL Final    Comment:    The total PSA value from this assay system is  standardized against the WHO standard. The test  result will be approximately 20% lower when compared  to the equimolar-standardized total PSA (Beckman  Coulter). Comparison of serial PSA results should be  interpreted with this fact in mind. . This test was performed using the Siemens  chemiluminescent method. Values obtained from  different assay methods cannot be used interchangeably. PSA levels, regardless of value, should not be interpreted as absolute evidence of the presence or absence of disease.          Passed - Last BP in normal range    BP Readings from Last 1 Encounters:  11/12/22 114/76         Passed - Valid encounter within last 12 months    Recent Outpatient Visits           2 months ago [place], DO   3 months ago [place], DO   5 months ago Chronic elbow pain, right   [place], DO   7 months ago Right lateral epicondylitis   [place], DO   11 months ago Spermatic cord pain   [place], DO       Future Appointments             In 2 months Laurence Ferrari, [place], [place]   In 9 months Ralene Bathe, MD [place]

## 2022-12-08 ENCOUNTER — Other Ambulatory Visit: Payer: Self-pay | Admitting: Family Medicine

## 2022-12-08 ENCOUNTER — Encounter: Payer: Self-pay | Admitting: Family Medicine

## 2022-12-08 DIAGNOSIS — F902 Attention-deficit hyperactivity disorder, combined type: Secondary | ICD-10-CM

## 2022-12-10 ENCOUNTER — Encounter: Payer: Self-pay | Admitting: Dermatology

## 2023-01-08 ENCOUNTER — Other Ambulatory Visit: Payer: Self-pay | Admitting: Family Medicine

## 2023-01-08 DIAGNOSIS — F902 Attention-deficit hyperactivity disorder, combined type: Secondary | ICD-10-CM

## 2023-01-08 MED ORDER — CONCERTA 18 MG PO TBCR: 18.0000 mg | EXTENDED_RELEASE_TABLET | Freq: Every day | ORAL | 0 refills | Status: DC

## 2023-01-21 ENCOUNTER — Encounter: Payer: Self-pay | Admitting: Dermatology

## 2023-01-21 ENCOUNTER — Ambulatory Visit (INDEPENDENT_AMBULATORY_CARE_PROVIDER_SITE_OTHER): Payer: 59 | Admitting: Dermatology

## 2023-01-21 VITALS — BP 113/74 | HR 82

## 2023-01-21 DIAGNOSIS — L570 Actinic keratosis: Secondary | ICD-10-CM

## 2023-01-21 DIAGNOSIS — Z7189 Other specified counseling: Secondary | ICD-10-CM | POA: Diagnosis not present

## 2023-01-21 DIAGNOSIS — L578 Other skin changes due to chronic exposure to nonionizing radiation: Secondary | ICD-10-CM

## 2023-01-21 NOTE — Progress Notes (Signed)
   Follow-Up Visit   Subjective  Felice Deem is a 62 y.o. male who presents for the following: AK recheck. S/P 5FU treatment on face as directed. Patient reports he did get a significant reaction.  The patient has spots, moles and lesions to be evaluated, some may be new or changing and the patient has concerns that these could be cancer.   The following portions of the chart were reviewed this encounter and updated as appropriate: medications, allergies, medical history  Review of Systems:  No other skin or systemic complaints except as noted in HPI or Assessment and Plan.  Objective  Well appearing patient in no apparent distress; mood and affect are within normal limits.  A focused examination was performed of the following areas: Face  Relevant physical exam findings are noted in the Assessment and Plan.    Assessment & Plan   ACTINIC DAMAGE WITH PRECANCEROUS ACTINIC KERATOSES Counseling for Topical Chemotherapy Management: Patient exhibits: - Severe, confluent actinic changes with pre-cancerous actinic keratoses that is secondary to cumulative UV radiation exposure over time - Condition that is severe; chronic, not at goal. - diffuse scaly erythematous macules and papules with underlying dyspigmentation - Discussed Prescription "Field Treatment" topical Chemotherapy for Severe, Chronic Confluent Actinic Changes with Pre-Cancerous Actinic Keratoses Field treatment involves treatment of an entire area of skin that has confluent Actinic Changes (Sun/ Ultraviolet light damage) and PreCancerous Actinic Keratoses by method of PhotoDynamic Therapy (PDT) and/or prescription Topical Chemotherapy agents such as 5-fluorouracil, 5-fluorouracil/calcipotriene, and/or imiquimod.  The purpose is to decrease the number of clinically evident and subclinical PreCancerous lesions to prevent progression to development of skin cancer by chemically destroying early precancer changes that may or may  not be visible.  It has been shown to reduce the risk of developing skin cancer in the treated area. As a result of treatment, redness, scaling, crusting, and open sores may occur during treatment course. One or more than one of these methods may be used and may have to be used several times to control, suppress and eliminate the PreCancerous changes. Discussed treatment course, expected reaction, and possible side effects. - Recommend daily broad spectrum sunscreen SPF 30+ to sun-exposed areas, reapply every 2 hours as needed.  - Staying in the shade or wearing long sleeves, sun glasses (UVA+UVB protection) and wide brim hats (4-inch brim around the entire circumference of the hat) are also recommended. - Call for new or changing lesions.  Re-Start Fluorouracil 5% cream twice daily for 1 week to left cheek. Reviewed course of treatment and expected reaction. Patient advised to expect inflammation and crusting and advised that erosions are possible. Patient advised to be diligent with sun protection during and after treatment. Counseled to keep medication out of reach of children and pets.   Return for Follow Up As Scheduled.  I, Lawson Radar, CMA, am acting as scribe for Darden Dates, MD.   Documentation: I have reviewed the above documentation for accuracy and completeness, and I agree with the above.  Darden Dates, MD

## 2023-01-21 NOTE — Patient Instructions (Addendum)
Re-Start Fluorouracil 5% cream twice daily for 1 week to left cheek. Reviewed course of treatment and expected reaction. Patient advised to expect inflammation and crusting and advised that erosions are possible. Patient advised to be diligent with sun protection during and after treatment. Counseled to keep medication out of reach of children and pets.    Recommend daily broad spectrum sunscreen SPF 30+ to sun-exposed areas, reapply every 2 hours as needed. Call for new or changing lesions.  Staying in the shade or wearing long sleeves, sun glasses (UVA+UVB protection) and wide brim hats (4-inch brim around the entire circumference of the hat) are also recommended for sun protection.    Recommend taking Heliocare sun protection supplement daily in sunny weather for additional sun protection. For maximum protection on the sunniest days, you can take up to 2 capsules of regular Heliocare OR take 1 capsule of Heliocare Ultra. For prolonged exposure (such as a full day in the sun), you can repeat your dose of the supplement 4 hours after your first dose. Heliocare can be purchased at [employer], at some [facility] or at [email or web address].     Due to recent changes in healthcare laws, you may see results of your pathology and/or laboratory studies on MyChart before the doctors have had a chance to review them. We understand that in some cases there may be results that are confusing or concerning to you. Please understand that not all results are received at the same time and often the doctors may need to interpret multiple results in order to provide you with the best plan of care or course of treatment. Therefore, we ask that you please give [place] 2 business days to thoroughly review all your results before contacting the office for clarification. Should we see a critical lab result, you will be contacted sooner.   If You Need Anything After Your Visit  If you have any questions or concerns for  your doctor, please call our main line at [phone] and press option 4 to reach your doctor's medical assistant. If no one answers, please leave a voicemail as directed and we will return your call as soon as possible. Messages left after 4 pm will be answered the following business day.   You may also send [place] a message via MyChart. We typically respond to MyChart messages within 1-2 business days.  For prescription refills, please ask your pharmacy to contact our office. Our fax number is [phone].  If you have an urgent issue when the clinic is closed that cannot wait until the next business day, you can page your doctor at the number below.    Please note that while we do our best to be available for urgent issues outside of office hours, we are not available 24/7.   If you have an urgent issue and are unable to reach [place], you may choose to seek medical care at your doctor's office, retail clinic, urgent care center, or emergency room.  If you have a medical emergency, please immediately call 911 or go to the emergency department.  Pager Numbers  - Dr. Gwen Pounds: [phone]  - Dr. Neale Burly: [phone]  - Dr. Roseanne Reno: [phone]  In the event of inclement weather, please call our main line at [phone] for an update on the status of any delays or closures.  Dermatology Medication Tips: Please keep the boxes that topical medications come in in order to help keep track of the instructions about where and how to use these. Pharmacies  typically print the medication instructions only on the boxes and not directly on the medication tubes.   If your medication is too expensive, please contact our office at [phone] option 4 or send [place] a message through MyChart.   We are unable to tell what your co-pay for medications will be in advance as this is different depending on your insurance coverage. However, we may be able to find a substitute medication at lower cost or fill out  paperwork to get insurance to cover a needed medication.   If a prior authorization is required to get your medication covered by your insurance company, please allow [place] 1-2 business days to complete this process.  Drug prices often vary depending on where the prescription is filled and some pharmacies may offer cheaper prices.  The website www.goodrx.com contains coupons for medications through different pharmacies. The prices here do not account for what the cost may be with help from insurance (it may be cheaper with your insurance), but the website can give you the price if you did not use any insurance.  - You can print the associated coupon and take it with your prescription to the pharmacy.  - You may also stop by our office during regular business hours and pick up a GoodRx coupon card.  - If you need your prescription sent electronically to a different pharmacy, notify our office through [facility] or by phone at [phone] option 4.     Si Usted Necesita Algo Despus de Su Visita  Tambin puede enviarnos un mensaje a travs de Clinical cytogeneticist. Por lo general respondemos a los mensajes de MyChart en el transcurso de 1 a 2 das hbiles.  Para renovar recetas, por favor pida a su farmacia que se ponga en contacto con nuestra oficina. Annie Sable de fax es [place] [phone].  Si tiene un asunto urgente cuando la clnica est cerrada y que no puede esperar hasta el siguiente da hbil, puede llamar/localizar a su doctor(a) al nmero que aparece a continuacin.   Por favor, tenga en cuenta que aunque hacemos todo lo posible para estar disponibles para asuntos urgentes fuera del horario de [place], no estamos disponibles las 24 horas del da, los 7 [address]  [address] de la [place].   Si tiene un problema urgente y no puede comunicarse con nosotros, puede optar por buscar atencin mdica  en el consultorio de su doctor(a), en una clnica privada, en un centro de atencin urgente o en una sala de  emergencias.  Si tiene Engineer, drilling, por favor llame inmediatamente al 911 o vaya a la sala de emergencias.  Nmeros de bper  - Dr. Gwen Pounds: [phone]  - Dra. Moye: [phone]  - Dra. Roseanne Reno: [phone]  En caso de inclemencias del [place], por favor llame a Lacy Duverney principal al [phone] para una actualizacin sobre el [place] de cualquier retraso o cierre.  Consejos para la medicacin en dermatologa: Por favor, guarde las cajas en las que vienen los medicamentos de uso tpico para ayudarle a seguir las instrucciones sobre dnde y cmo usarlos. Las farmacias generalmente imprimen las instrucciones del medicamento slo en las cajas y no directamente en los tubos del [place].   Si su medicamento es muy caro, por favor, pngase en contacto con Rolm Gala llamando al [phone] y presione la opcin 4 o envenos un mensaje a travs de Clinical cytogeneticist.   No podemos decirle cul ser su copago por los medicamentos por adelantado ya que esto es diferente dependiendo de la [place] de  su seguro. Sin embargo, es posible que podamos encontrar un medicamento sustituto a Audiological scientist un formulario para que el seguro cubra el medicamento que se considera necesario.   Si se requiere una autorizacin previa para que su compaa de seguros [place] su medicamento, por favor permtanos de 1 a 2 das hbiles para completar [address]  Los precios de los medicamentos varan con frecuencia dependiendo del Environmental consultant de dnde se surte la receta y alguna farmacias pueden ofrecer precios ms baratos.  El sitio web www.goodrx.com tiene cupones para medicamentos de Health and safety inspector. Los precios aqu no tienen en cuenta lo que podra costar con la ayuda del seguro (puede ser ms barato con su seguro), pero el sitio web puede darle el precio si no utiliz Tourist information centre manager.  - Puede imprimir el cupn correspondiente y llevarlo con su receta a la farmacia.  - Tambin puede pasar por  nuestra oficina durante el horario de atencin regular y Education officer, museum una tarjeta de cupones de GoodRx.  - Si necesita que su receta se enve electrnicamente a una farmacia diferente, informe a nuestra oficina a travs de MyChart de [place] o por telfono llamando al [phone] y presione la opcin 4.

## 2023-02-11 ENCOUNTER — Other Ambulatory Visit: Payer: Self-pay | Admitting: Family Medicine

## 2023-02-11 ENCOUNTER — Encounter: Payer: Self-pay | Admitting: Family Medicine

## 2023-02-11 DIAGNOSIS — F902 Attention-deficit hyperactivity disorder, combined type: Secondary | ICD-10-CM

## 2023-03-06 ENCOUNTER — Encounter: Payer: Self-pay | Admitting: Family Medicine

## 2023-03-09 ENCOUNTER — Encounter: Payer: Self-pay | Admitting: Family Medicine

## 2023-03-09 DIAGNOSIS — F902 Attention-deficit hyperactivity disorder, combined type: Secondary | ICD-10-CM

## 2023-03-09 DIAGNOSIS — N401 Enlarged prostate with lower urinary tract symptoms: Secondary | ICD-10-CM

## 2023-03-09 DIAGNOSIS — F419 Anxiety disorder, unspecified: Secondary | ICD-10-CM

## 2023-03-09 MED ORDER — ESCITALOPRAM OXALATE 20 MG PO TABS: 20.0000 mg | ORAL_TABLET | Freq: Every day | ORAL | 3 refills | Status: DC

## 2023-03-09 MED ORDER — CONCERTA 18 MG PO TBCR: 18.0000 mg | EXTENDED_RELEASE_TABLET | Freq: Every day | ORAL | 0 refills | Status: DC

## 2023-03-09 MED ORDER — TAMSULOSIN HCL 0.4 MG PO CAPS: ORAL_CAPSULE | ORAL | 3 refills | Status: DC

## 2023-03-17 MED ORDER — CONCERTA 18 MG PO TBCR: 18.0000 mg | EXTENDED_RELEASE_TABLET | Freq: Every day | ORAL | 0 refills | Status: DC

## 2023-03-17 NOTE — Addendum Note (Signed)
Addended by: Smitty Cords on: 03/17/2023 04:50 PM   Modules accepted: Orders

## 2023-03-17 NOTE — Addendum Note (Signed)
Addended by: Paschal Dopp on: 03/17/2023 08:32 AM   Modules accepted: Orders

## 2023-03-18 ENCOUNTER — Encounter: Payer: Self-pay | Admitting: Family Medicine

## 2023-03-18 ENCOUNTER — Ambulatory Visit (INDEPENDENT_AMBULATORY_CARE_PROVIDER_SITE_OTHER): Payer: 59 | Admitting: Family Medicine

## 2023-03-18 VITALS — BP 118/80 | HR 70 | Temp 98.3°F | Ht 74.0 in | Wt 196.0 lb

## 2023-03-18 DIAGNOSIS — I7121 Aneurysm of the ascending aorta, without rupture: Secondary | ICD-10-CM

## 2023-03-18 DIAGNOSIS — Z0000 Encounter for general adult medical examination without abnormal findings: Secondary | ICD-10-CM

## 2023-03-18 DIAGNOSIS — E7800 Pure hypercholesterolemia, unspecified: Secondary | ICD-10-CM

## 2023-03-18 DIAGNOSIS — R7309 Other abnormal glucose: Secondary | ICD-10-CM

## 2023-03-18 DIAGNOSIS — R351 Nocturia: Secondary | ICD-10-CM

## 2023-03-18 DIAGNOSIS — F902 Attention-deficit hyperactivity disorder, combined type: Secondary | ICD-10-CM

## 2023-03-18 DIAGNOSIS — F419 Anxiety disorder, unspecified: Secondary | ICD-10-CM

## 2023-03-18 DIAGNOSIS — N401 Enlarged prostate with lower urinary tract symptoms: Secondary | ICD-10-CM

## 2023-03-18 NOTE — Patient Instructions (Addendum)
Thank you for coming to the office today.  Keep in touch if any issues with the Concert[facility] rx at [facility]  Labs today.  Recommend Colonoscopy repeat screening test after Feb 2025 Last done 11/17/13  Referral when ready - to GI, please let me know.  [facility]) [address] - [address] [place], [place] [address] Phone: [phone]  Future Shingrix vaccine at pharmacy, recommended.  Please schedule a Follow-up Appointment to: Return in about 6 months (around 09/17/2023) for 6 month follow-up ADHD Anxiety meds MyChart Video .  If you have any other questions or concerns, please feel free to call the office or send a message through MyChart. You may also schedule an earlier appointment if necessary.  Additionally, you may be receiving a survey about your experience at our office within a few days to 1 week by e-mail or mail. We value your feedback.  Saralyn Pilar, DO [facility], [place]

## 2023-03-18 NOTE — Assessment & Plan Note (Signed)
Controlled / improved anxiety On SSRI Escitalopram 20mg  daily

## 2023-03-18 NOTE — Assessment & Plan Note (Signed)
Controlled / Improved ADHD Failed meds: Strattera, Wellbutrin, Concerta 27mg   Continue on Concerta 18mg  CR daily AS NEEDED  [facility]. Will resubmit next 3 future orders on hold when requested.

## 2023-03-18 NOTE — Progress Notes (Signed)
Subjective:    Patient ID: Ethan Glenn, male    DOB: 1961-02-04, 62 y.o.   MRN: [id number]  Ethan Glenn is a 62 y.o. male presenting on 03/18/2023 for Annual Exam   HPI  Here for Annual Physical and Lab Review  Goal to keep baseline weight at 190 lbs He has done well with exercise and activity He does a lot of yardwork and stays active  Admits occasional minor Sciatica pain if prolonged sitting. Improved with massage therapy once monthly Follows w/ [facility]  New eye doctor doing well  Migraines less with reduced stress. Improved on SSRI Lexapro Now since retired.  Family history Cardiovascular Disease Fam hx Brother 4 years older, age 6, was just recently diagnosed with a mild heart attack. Sister 1 year older, she has had some prior heart problems. She went to Cardiologist and advised 99% risk of heart attack. Of 6 siblings, he has had done well with both his genetic history and lifestyle. Significant family history with his father and his paternal grandfather with heart attack in past.  Thoracic Ascending Aorta Aneurysm Last imaging 09/08/22 with 4.1 cm measurement   HYPERLIPIDEMIA: - Reports concerns as listed above. Last lipid panel 2022, he had elevated LDL 118 previously Not on Statin therapy  Anxiety, ADHD follow-up Improved control now on SSRI Escitalopram 20mg  daily and also Concerta 18mg  CR daily    Health Maintenance:  Colonoscopy - last done 11/17/13, negative, repeat 10 yr, due in 10/2023. He will consider pursuing referral to GI - prefers [facility]      03/18/2023    9:46 AM 09/02/2022   11:36 AM 08/11/2022    2:30 PM  Depression screen PHQ 2/9  Decreased Interest 0 1 2  Down, Depressed, Hopeless 0 0 0  PHQ - 2 Score 0 1 2  Altered sleeping 0 2 2  Tired, decreased energy 0 1 2  Change in appetite 0 1 0  Feeling bad or failure about yourself  0 0 0  Trouble concentrating 0 3 3  Moving slowly or  fidgety/restless 0 2 3  Suicidal thoughts 0 0 0  PHQ-9 Score 0 10 12  Difficult doing work/chores Not difficult at all Somewhat difficult Very difficult    Past Medical History:  Diagnosis Date   ADHD    Allergic rhinitis    Anxiety    Frequent headaches    Migraine    Past Surgical History:  Procedure Laterality Date   HERNIA REPAIR  1965   umbilical   KNEE SURGERY Left 1984   knee arthroscopic meniscectomy   Social History   Socioeconomic History   Marital status: Married    Spouse name: Not on file   Number of children: Not on file   Years of education: Not on file   Highest education level: Not on file  Occupational History   Not on file  Tobacco Use   Smoking status: Former    Types: Cigarettes    Quit date: 08/07/1986    Years since quitting: 36.6   Smokeless tobacco: Never  Vaping Use   Vaping Use: Never used  Substance and Sexual Activity   Alcohol use: Yes    Alcohol/week: 3.0 standard drinks of alcohol    Types: 3 Standard drinks or equivalent per week   Drug use: Never   Sexual activity: Not on file  Other Topics Concern   Not on file  Social History Narrative   Not  on file   Social Determinants of Health   Financial Resource Strain: Not on file  Food Insecurity: Not on file  Transportation Needs: Not on file  Physical Activity: Not on file  Stress: Not on file  Social Connections: Not on file  Intimate Partner Violence: Not on file   Family History  Problem Relation Age of Onset   Glaucoma Mother    Ovarian cancer Mother    Hypertension Father    Stroke Father    Anxiety disorder Sister    Current Outpatient Medications on File Prior to Visit  Medication Sig   CONCERTA 18 MG CR tablet Take 1 tablet (18 mg total) by mouth daily.   escitalopram (LEXAPRO) 20 MG tablet Take 1 tablet (20 mg total) by mouth daily.   ketoconazole (NIZORAL) 2 % shampoo apply three times per week, massage into scalp and leave in for 3 - 5 minutes before  rinsing out   loratadine (CLARITIN) 10 MG tablet Take 10 mg by mouth daily.   Multiple Vitamins-Minerals (PRESERVISION AREDS PO) Take by mouth daily.   SUMAtriptan (IMITREX) 20 MG/ACT nasal spray PLACE 1 SPRAY (20 MG TOTAL) INTO THE NOSE EVERY 2 (TWO) HOURS AS NEEDED FOR MIGRAINE OR HEADACHE. MAY REPEAT IN 2 HOURS IF HEADACHE PERSISTS OR RECURS.   tamsulosin (FLOMAX) 0.4 MG CAPS capsule TAKE 1 CAPSULE BY MOUTH EVERY DAY AFTER SUPPER   imiquimod (ALDARA) 5 % cream Apply every other night to affected areas on feet (Patient not taking: Reported on 03/18/2023)   mometasone (ELOCON) 0.1 % lotion Apply topically to aa's nightly 5 times weekly for a couple of weeks. If seeing improvement can use just 3 times weekly (Patient not taking: Reported on 03/18/2023)   No current facility-administered medications on file prior to visit.    Review of Systems  Constitutional:  Negative for activity change, appetite change, chills, diaphoresis, fatigue and fever.  HENT:  Negative for congestion and hearing loss.   Eyes:  Negative for visual disturbance.  Respiratory:  Negative for cough, chest tightness, shortness of breath and wheezing.   Cardiovascular:  Negative for chest pain, palpitations and leg swelling.  Gastrointestinal:  Negative for abdominal pain, constipation, diarrhea, nausea and vomiting.  Genitourinary:  Negative for dysuria, frequency and hematuria.  Musculoskeletal:  Negative for arthralgias and neck pain.  Skin:  Negative for rash.  Neurological:  Negative for dizziness, weakness, light-headedness, numbness and headaches.  Hematological:  Negative for adenopathy.  Psychiatric/Behavioral:  Negative for behavioral problems, dysphoric mood and sleep disturbance.    Per HPI unless specifically indicated above      Objective:    BP 118/80 (BP Location: Left Arm, Patient Position: Sitting, Cuff Size: Large)   Pulse 70   Temp 98.3 F (36.8 C) (Oral)   Ht 6\' 2"  (1.88 m)   Wt 196 lb (88.9  kg)   SpO2 97%   BMI 25.16 kg/m   Wt Readings from Last 3 Encounters:  03/18/23 196 lb (88.9 kg)  09/28/22 201 lb (91.2 kg)  09/02/22 197 lb (89.4 kg)    Physical Exam Vitals and nursing note reviewed.  Constitutional:      General: He is not in acute distress.    Appearance: He is well-developed. He is not diaphoretic.     Comments: Well-appearing, comfortable, cooperative  HENT:     Head: Normocephalic and atraumatic.  Eyes:     General:        Right eye: No discharge.  Left eye: No discharge.     Conjunctiva/sclera: Conjunctivae normal.     Pupils: Pupils are equal, round, and reactive to light.  Neck:     Thyroid: No thyromegaly.     Vascular: No carotid bruit.  Cardiovascular:     Rate and Rhythm: Normal rate and regular rhythm.     Pulses: Normal pulses.     Heart sounds: Normal heart sounds. No murmur heard. Pulmonary:     Effort: Pulmonary effort is normal. No respiratory distress.     Breath sounds: Normal breath sounds. No wheezing or rales.  Abdominal:     General: Bowel sounds are normal. There is no distension.     Palpations: Abdomen is soft. There is no mass.     Tenderness: There is no abdominal tenderness.  Musculoskeletal:        General: No tenderness. Normal range of motion.     Cervical back: Normal range of motion and neck supple.     Right lower leg: No edema.     Left lower leg: No edema.     Comments: Upper / Lower Extremities: - Normal muscle tone, strength bilateral upper extremities 5/5, lower extremities 5/5  Lymphadenopathy:     Cervical: No cervical adenopathy.  Skin:    General: Skin is warm and dry.     Findings: No erythema or rash.  Neurological:     Mental Status: He is alert and oriented to person, place, and time.     Comments: Distal sensation intact to light touch all extremities  Psychiatric:        Mood and Affect: Mood normal.        Behavior: Behavior normal.        Thought Content: Thought content normal.      Comments: Well groomed, good eye contact, normal speech and thoughts    Results for orders placed or performed in visit on 08/27/22  HM COLONOSCOPY  Result Value Ref Range   HM Colonoscopy See Report (in chart) See Report (in chart), Patient Reported      Assessment & Plan:   Problem List Items Addressed This Visit     ADHD    Controlled / Improved ADHD Failed meds: Strattera, Wellbutrin, Concerta 27mg   Continue on Concerta 18mg  CR daily AS NEEDED  [facility]. Will resubmit next 3 future orders on hold when requested.      Anxiety    Controlled / improved anxiety On SSRI Escitalopram 20mg  daily      Benign prostatic hyperplasia with nocturia   Relevant Orders   PSA   Pure hypercholesterolemia    Mild elevated LDL 118 previously Check labs now The 10-year ASCVD risk score (Arnett DK, et al., 2019) is: 5.8%  Plan: Not indicated for statin therapy Encourage improved lifestyle - low carb/cholesterol, reduce portion size, continue improving regular exercise       Relevant Orders   COMPLETE METABOLIC PANEL WITH GFR   CBC with Differential/Platelet   Lipid panel   TSH   Thoracic ascending aortic aneurysm (HCC)   Other Visit Diagnoses     Annual physical exam    -  Primary   Relevant Orders   COMPLETE METABOLIC PANEL WITH GFR   CBC with Differential/Platelet   Hemoglobin A1c   Lipid panel   PSA   TSH   Abnormal glucose       Relevant Orders   Hemoglobin A1c  Updated Health Maintenance information Fasting labs ordered today and drawn, pending results. Encouraged improvement to lifestyle with diet and exercise Goal maintain healthy weight  Recommend Colonoscopy repeat screening test after Feb 2025 Last done 11/17/13 Will submit referral when requested to [facility]  Future Shingrix vaccine at pharmacy, recommended.  No orders of the defined types were placed in this  encounter.     Follow up plan: Return in about 6 months (around 09/17/2023) for 6 month follow-up ADHD Anxiety meds MyChart Video .  Saralyn Pilar, DO [facility] 03/18/2023, 10:00 AM

## 2023-03-18 NOTE — Assessment & Plan Note (Signed)
Mild elevated LDL 118 previously Check labs now The 10-year ASCVD risk score (Arnett DK, et al., 2019) is: 5.8%  Plan: Not indicated for statin therapy Encourage improved lifestyle - low carb/cholesterol, reduce portion size, continue improving regular exercise

## 2023-03-19 LAB — CBC WITH DIFFERENTIAL/PLATELET
Absolute Monocytes: 472 cells/uL (ref 200–950)
Basophils Absolute: 32 cells/uL (ref 0–200)
Basophils Relative: 0.6 %
Eosinophils Absolute: 133 cells/uL (ref 15–500)
Eosinophils Relative: 2.5 %
HCT: 41.9 % (ref 38.5–50.0)
Hemoglobin: 14.2 g/dL (ref 13.2–17.1)
Lymphs Abs: 1622 cells/uL (ref 850–3900)
MCH: 30.5 pg (ref 27.0–33.0)
MCHC: 33.9 g/dL (ref 32.0–36.0)
MCV: 90.1 fL (ref 80.0–100.0)
MPV: 10.6 fL (ref 7.5–12.5)
Monocytes Relative: 8.9 %
Neutro Abs: 3042 cells/uL (ref 1500–7800)
Neutrophils Relative %: 57.4 %
Platelets: 332 10*3/uL (ref 140–400)
RBC: 4.65 10*6/uL (ref 4.20–5.80)
RDW: 12.4 % (ref 11.0–15.0)
Total Lymphocyte: 30.6 %
WBC: 5.3 10*3/uL (ref 3.8–10.8)

## 2023-03-19 LAB — HEMOGLOBIN A1C
Hgb A1c MFr Bld: 5.5 % of total Hgb (ref <5.7)
Mean Plasma Glucose: 111 mg/dL
eAG (mmol/L): 6.2 mmol/L

## 2023-03-19 LAB — COMPLETE METABOLIC PANEL WITH GFR
AG Ratio: 1.8 (calc) (ref 1.0–2.5)
ALT: 23 U/L (ref 9–46)
AST: 20 U/L (ref 10–35)
Albumin: 4.6 g/dL (ref 3.6–5.1)
Alkaline phosphatase (APISO): 77 U/L (ref 35–144)
BUN: 13 mg/dL (ref 7–25)
CO2: 26 mmol/L (ref 20–32)
Calcium: 10.5 mg/dL — ABNORMAL HIGH (ref 8.6–10.3)
Chloride: 103 mmol/L (ref 98–110)
Creat: 1.03 mg/dL (ref 0.70–1.30)
Globulin: 2.5 g/dL (calc) (ref 1.9–3.7)
Glucose, Bld: 84 mg/dL (ref 65–99)
Potassium: 4.6 mmol/L (ref 3.5–5.3)
Sodium: 137 mmol/L (ref 135–146)
Total Bilirubin: 1.2 mg/dL (ref 0.2–1.2)
Total Protein: 7.1 g/dL (ref 6.1–8.1)
eGFR: 84 mL/min/{1.73_m2} (ref >=60)

## 2023-03-19 LAB — LIPID PANEL
Cholesterol: 228 mg/dL — ABNORMAL HIGH (ref <200)
HDL: 74 mg/dL (ref >=40)
LDL Cholesterol (Calc): 135 mg/dL (calc) — ABNORMAL HIGH
Non-HDL Cholesterol (Calc): 154 mg/dL (calc) — ABNORMAL HIGH (ref <130)
Total CHOL/HDL Ratio: 3.1 (calc) (ref <5.0)
Triglycerides: 88 mg/dL (ref <150)

## 2023-03-19 LAB — TSH: TSH: 1.66 mIU/L (ref 0.40–4.50)

## 2023-03-19 LAB — PSA: PSA: 1.07 ng/mL (ref <=4.00)

## 2023-03-23 ENCOUNTER — Encounter: Payer: Self-pay | Admitting: Family Medicine

## 2023-04-20 ENCOUNTER — Encounter: Payer: Self-pay | Admitting: Family Medicine

## 2023-04-22 ENCOUNTER — Encounter: Payer: Self-pay | Admitting: Family Medicine

## 2023-05-10 ENCOUNTER — Ambulatory Visit
Admission: EM | Admit: 2023-05-10 | Discharge: 2023-05-10 | Disposition: A | Discharge status: Home / Self Care | Payer: 59 | Attending: Emergency Medicine | Admitting: Emergency Medicine

## 2023-05-10 DIAGNOSIS — M7918 Myalgia, other site: Secondary | ICD-10-CM

## 2023-05-10 DIAGNOSIS — R6884 Jaw pain: Secondary | ICD-10-CM

## 2023-05-10 MED ORDER — CYCLOBENZAPRINE HCL 10 MG PO TABS: 10.0000 mg | ORAL_TABLET | Freq: Two times a day (BID) | ORAL | 0 refills | Status: DC | PRN
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
## 2023-05-10 NOTE — Discharge Instructions (Addendum)
Take ibuprofen as needed for discomfort.    Take the muscle relaxer as needed for muscle spasm; Do not drive, operate machinery, or drink alcohol with this medication as it can cause drowsiness.   Follow up with your primary care provider.  Go to the emergency department if you have persistent or worsening symptoms.
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
## 2023-05-12 ENCOUNTER — Telehealth (INDEPENDENT_AMBULATORY_CARE_PROVIDER_SITE_OTHER): Payer: 59 | Admitting: Family Medicine

## 2023-05-12 ENCOUNTER — Encounter: Payer: Self-pay | Admitting: Family Medicine

## 2023-05-12 DIAGNOSIS — R6884 Jaw pain: Secondary | ICD-10-CM

## 2023-05-12 DIAGNOSIS — S134XXA Sprain of ligaments of cervical spine, initial encounter: Secondary | ICD-10-CM

## 2023-05-12 DIAGNOSIS — M542 Cervicalgia: Secondary | ICD-10-CM | POA: Diagnosis not present

## 2023-05-12 DIAGNOSIS — M62830 Muscle spasm of back: Secondary | ICD-10-CM
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
## 2023-05-21 ENCOUNTER — Encounter: Payer: Self-pay | Admitting: Family Medicine

## 2023-05-21 DIAGNOSIS — F419 Anxiety disorder, unspecified: Secondary | ICD-10-CM

## 2023-05-25 MED ORDER — BUSPIRONE HCL 5 MG PO TABS: 5.0000 mg | ORAL_TABLET | Freq: Three times a day (TID) | ORAL | 2 refills | Status: DC | PRN

## 2023-05-25 NOTE — Addendum Note (Signed)
Addended by: Smitty Cords on: 05/25/2023 07:06 PM   Modules accepted: Orders

## 2023-06-16 ENCOUNTER — Telehealth (INDEPENDENT_AMBULATORY_CARE_PROVIDER_SITE_OTHER): Payer: 59 | Admitting: Family Medicine

## 2023-06-16 ENCOUNTER — Encounter: Payer: Self-pay | Admitting: Family Medicine

## 2023-06-16 DIAGNOSIS — F419 Anxiety disorder, unspecified: Secondary | ICD-10-CM

## 2023-06-16 DIAGNOSIS — F909 Attention-deficit hyperactivity disorder, unspecified type: Secondary | ICD-10-CM

## 2023-06-16 NOTE — Patient Instructions (Signed)
Thank you for coming to the office today.  These offices have both PSYCHIATRY doctors and THERAPISTS  [facility] (Virtual Available) [place] [address] [place], [place] [address] Phone: [phone]  [facility] Address: [address], [place], [place] [address] [email or web address] Phone: [phone]  [facility]) Address: [address], [place], [place] [address] Hours: 8:30AM-5PM Phone: [phone]  [facility] (Adult, Peds, Geriatric, Counseling) [address] [place], [place] [address] Phone: [phone] Fax: [phone]  [facility] at [facility] [address] [place], [place] [address] Phone: [phone]  [facility] (All ages) [address] [place], [id number] Phone: [phone] (Option 1) [email or web address]  ----------------------------------------------------------------- THERAPIST ONLY  (No Psychiatry)  [facility] [address] [place], [place] [address] [place] P: [phone]  [facility]) [facility], [facility] [address] [place], [place] [address] [phone]  [facility]   Address: [address] [place], [place] [address] Hours: Open today  9AM-7PM Phone: [phone]  [facility] [address], [facility]  - [facility] Address: [address] [place], [place] [address] Phone: [phone]  [facility] [place], [place] [address] Phone: [phone]   Please schedule a Follow-up Appointment to: No follow-ups on file.  If you have any other questions or concerns, please feel free to call the office or send a message through MyChart. You may also schedule an earlier appointment if necessary.  Additionally, you may be  receiving a survey about your experience at our office within a few days to 1 week by e-mail or mail. We value your feedback.  Saralyn Pilar, DO [facility], [place]

## 2023-06-16 NOTE — Progress Notes (Signed)
Subjective:    Patient ID: Ethan Glenn, male    DOB: 03/08/1961, 62 y.o.   MRN: [id number]  Ethan Glenn is a 62 y.o. male presenting on 06/16/2023 for Anxiety  Virtual / Telehealth Encounter - Video Visit via MyChart The purpose of this virtual visit is to provide medical care while limiting exposure to the novel coronavirus (COVID19) for both patient and office staff.  Consent was obtained for remote visit:  Yes.   Answered questions that patient had about telehealth interaction:  Yes.   I discussed the limitations, risks, security and privacy concerns of performing an evaluation and management service by video/telephone. I also discussed with the patient that there may be a patient responsible charge related to this service. The patient expressed understanding and agreed to proceed.  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  He has retired since and then has had difficulty with spending time with activities that are mentally and intellectually stimulating and entertaining and meaningful. He attempted [employer] business full time but it was not fulfilling .  He describes feelings of lonely and boredom if not more actively engaged in meaningful work.  He admits he is not working for financial reasons now. His wife is still working and she loves her job and they are successful. He feels good on days that she is free and they have more time to spend together. He feels  like his stress and boredom does not impact him on those days. Eventually when she plans to retire, he believes his stress related to this issue will be improved. His personality does need personal interaction and engagement.  Currently out of work but he is actively interviewing.  He has been offered many teaching jobs. He has signed up for several different jobs over the past few years, [employer], [employer] in [place], and other Education officer, environmental job. One day he lost all of his teaching material and resources.  He does have the love and passion for teaching.  He describes stressors and anxiety and easily agitated now if working. He finds that triggers for his emotions or irritability or a low threshold is the problem and he has quit many jobs lately within past 1-2 years that either did not fulfill him or caused more stress.  He has been taking Escitalopram 20mg  daily for past 10 years approximately. He is constantly feeling more stressed out, feels eyes twitching and ears buzzing.  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 He did a lot of scoring but not stimulating or boring.  Interested in talking to Psychology therapy for cognitive therapy to help approach this mental hurdle impacting his anxiety  He has newly started Buspar 5mg  THREE TIMES A DAY AS NEEDED with significant improvement  No longer on Concerta 18 CR stopped several days ago and did not notice any difference without it. It was for ADHD but seems less effective. He prefers to  avoid extra meds now.      03/18/2023    9:46 AM 09/02/2022   11:36 AM 08/11/2022    2:30 PM  Depression screen PHQ 2/9  Decreased Interest 0 1 2  Down, Depressed, Hopeless 0 0 0  PHQ - 2 Score 0 1 2  Altered sleeping 0 2 2  Tired, decreased energy 0 1 2  Change in appetite 0 1 0  Feeling bad or failure about yourself  0 0 0  Trouble concentrating 0 3 3  Moving slowly or fidgety/restless 0 2 3  Suicidal thoughts 0 0 0  PHQ-9 Score  0 10 12  Difficult doing work/chores Not difficult at all Somewhat difficult Very difficult      03/18/2023    9:47 AM 09/02/2022   11:39 AM 08/11/2022    2:30 PM 06/01/2022   11:11 AM  GAD 7 : Generalized Anxiety Score  Nervous, Anxious, on Edge 0 1 2 0  Control/stop worrying 0 1 2 0  Worry too much - different things 0 1 2 0  Trouble relaxing 0 2 3 1   Restless 0 2 3 0  Easily annoyed or irritable 0 1 3 0  Afraid - awful might happen 0 0 0 0  Total GAD 7 Score 0 8 15 1   Anxiety Difficulty Not difficult at all Somewhat difficult Not difficult at all Not difficult at all      Social History   Tobacco Use   Smoking status: Former    Current packs/day: 0.00    Types: Cigarettes    Quit date: 08/07/1986    Years since quitting: 36.8   Smokeless tobacco: Never  Vaping Use   Vaping status: Never Used  Substance Use Topics   Alcohol use: Yes    Alcohol/week: 3.0 standard drinks of alcohol    Types: 3 Standard drinks or equivalent per week   Drug use: Never    Review of Systems Per HPI unless specifically indicated above     Objective:    There were no vitals taken for this visit.  Wt Readings from Last 3 Encounters:  03/18/23 196 lb (88.9 kg)  09/28/22 201 lb (91.2 kg)  09/02/22 197 lb (89.4 kg)    Physical Exam  Note examination was completely remotely via video observation objective data only  Gen - well-appearing, no acute distress or apparent pain, comfortable HEENT - eyes appear clear without discharge or redness Heart/Lungs - cannot examine virtually - observed no evidence of coughing or labored breathing. Abd - cannot examine virtually  Skin - face visible today- no rash Neuro - awake, alert, oriented Psych - not anxious appearing   Results for orders placed or performed in visit on 04/22/23  HM COLONOSCOPY  Result Value Ref Range   HM Colonoscopy See Report (in chart) See Report (in chart), Patient Reported  HM COLONOSCOPY  Result Value Ref Range    HM Colonoscopy See Report (in chart) See Report (in chart), Patient Reported      Assessment & Plan:   Problem List Items Addressed This Visit     ADHD   Anxiety - Primary    No orders of the defined types were placed in this encounter.   Assessment and Plan    Anxiety / Stress and Loneliness:  In retirement, Patient is experiencing stress and loneliness due to job search and lack of stimulation. Discussed the importance of finding a job that is engaging, stimulating, and low-stress. Also discussed the potential  benefits of cognitive behavioral therapy to address reactive behaviors and negative thought patterns.  Continue SSRI Lexapro 20mg  daily Continue Buspar 5mg  THREE TIMES A DAY AS NEEDED, he takes regularly, we can adjust dose if indicated, he can notify me.  Offer Psychology CBT referral, he can check resources on AVS. Consider virtual therapy options  -Consider cognitive behavioral therapy. -Continue job search efforts.  ADHD Remain off Concerta for now.    Follow up plan: Return if symptoms worsen or fail to improve.   Patient verbalizes understanding with the above medical recommendations including the limitation of remote medical advice.  Specific follow-up and call-back criteria were given for patient to follow-up or seek medical care more urgently if needed.  Total duration of direct patient care provided via video conference: 20 minutes   Saralyn Pilar, DO [facility] 06/16/2023, 11:49 AM

## 2023-07-06 ENCOUNTER — Encounter: Payer: Self-pay | Admitting: Family Medicine

## 2023-07-06 ENCOUNTER — Telehealth (INDEPENDENT_AMBULATORY_CARE_PROVIDER_SITE_OTHER): Payer: BC Managed Care – PPO | Admitting: Family Medicine

## 2023-07-06 DIAGNOSIS — F902 Attention-deficit hyperactivity disorder, combined type: Secondary | ICD-10-CM

## 2023-07-06 DIAGNOSIS — F419 Anxiety disorder, unspecified: Secondary | ICD-10-CM

## 2023-07-06 MED ORDER — CONCERTA 18 MG PO TBCR
18.0000 mg | EXTENDED_RELEASE_TABLET | Freq: Every day | ORAL | 0 refills | Status: DC
Start: 2023-07-07 — End: 2023-07-12

## 2023-07-06 NOTE — Patient Instructions (Signed)
° °  Please schedule a Follow-up Appointment to: No follow-ups on file. ° °If you have any other questions or concerns, please feel free to call the office or send a message through MyChart. You may also schedule an earlier appointment if necessary. ° °Additionally, you may be receiving a survey about your experience at our office within a few days to 1 week by e-mail or mail. We value your feedback. ° °Haylyn Halberg, DO °[facility] °

## 2023-07-06 NOTE — Progress Notes (Signed)
Subjective:    Patient ID: Ethan Glenn, male    DOB: 14-Jun-1961, 62 y.o.   MRN: [id number]  Ethan Glenn is a 62 y.o. male presenting on 07/06/2023 for No chief complaint on file.  Virtual / Telehealth Encounter - Video Visit via MyChart The purpose of this virtual visit is to provide medical care while limiting exposure to the novel coronavirus (COVID19) for both patient and office staff.  Consent was obtained for remote visit:  Yes.   Answered questions that patient had about telehealth interaction:  Yes.   I discussed the limitations, risks, security and privacy concerns of performing an evaluation and management service by video/telephone. I also discussed with the patient that there may be a patient responsible charge related to this service. The patient expressed understanding and agreed to proceed.  Patient Location: Home Provider Location: [facility] (Office)  Participants in virtual visit: - Patient: Ethan "Donnamarie Glenn - CMA: Shirley Muscat CMA - Provider: Dr Althea Charon   HPI  Anxiety ADHD Reduced Motivation  Follow up today from last visit 06/16/23. See prior note for background.    Interval updates he still expresses lack of motivation and more stress not working than he has when working. He feels unable to find purpose, has low energy, and is currently working on searching for new job to return to work after his retirement.  He has established with therapist, and has had 2 therapy sessions already. He says they are very helpful and he is learning to understand his situation better to process it.   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 He prefers this job path and would like this to work. He may start in January or Summer, it is TBD.  He would like to discuss medications today to optimize his treatment. He still experiences low motivation and energy, and a tendency to become easily agitated or  upset.  He questions if there is a mood or depressive diagnosis as well.  He feels "wired to work" and not enjoying his time when not working. He is not financially dependent on working but prefers to work.  The patient was previously on Concerta for ADD, which they stopped taking, and is currently on Lexapro and Buspar. They express concerns about the long-term effectiveness of Lexapro and the possibility of switching medications. They also express a desire to rely less on medication if their situation improves.  The hope is returning to a meaningful job will improve their day-to-day mood and overall mental health.   Also scheduled Trip to [facility] Oct 28 to 11/7  he admits inc dosing Buspar he feels some dizziness if moving too quickly. He prefers to avoid dose increase      07/06/2023    6:28 PM 03/18/2023    9:46 AM 09/02/2022   11:36 AM  Depression screen PHQ 2/9  Decreased Interest 1 0 1  Down, Depressed, Hopeless 0 0 0  PHQ - 2 Score 1 0 1  Altered sleeping 0 0 2  Tired, decreased energy 2 0 1  Change in appetite 0 0 1  Feeling bad or failure about yourself  0 0 0  Trouble concentrating 1 0 3  Moving slowly or fidgety/restless 0 0 2  Suicidal thoughts 0 0 0  PHQ-9 Score 4 0 10  Difficult doing work/chores Not difficult at all Not difficult at all Somewhat difficult      07/06/2023    6:28 PM  03/18/2023    9:47 AM 09/02/2022   11:39 AM 08/11/2022    2:30 PM  GAD 7 : Generalized Anxiety Score  Nervous, Anxious, on Edge 1 0 1 2  Control/stop worrying 0 0 1 2  Worry too much - different things 0 0 1 2  Trouble relaxing 1 0 2 3  Restless 0 0 2 3  Easily annoyed or irritable 1 0 1 3  Afraid - awful might happen 0 0 0 0  Total GAD 7 Score 3 0 8 15  Anxiety Difficulty Not difficult at all Not difficult at all Somewhat difficult Not difficult at all      Social History   Tobacco Use   Smoking status: Former    Current packs/day: 0.00    Types: Cigarettes    Quit  date: 08/07/1986    Years since quitting: 36.9   Smokeless tobacco: Never  Vaping Use   Vaping status: Never Used  Substance Use Topics   Alcohol use: Yes    Alcohol/week: 3.0 standard drinks of alcohol    Types: 3 Standard drinks or equivalent per week   Drug use: Never    Review of Systems Per HPI unless specifically indicated above     Objective:    There were no vitals taken for this visit.  Wt Readings from Last 3 Encounters:  03/18/23 196 lb (88.9 kg)  09/28/22 201 lb (91.2 kg)  09/02/22 197 lb (89.4 kg)    Physical Exam  Note examination was completely remotely via video observation objective data only  Gen - well-appearing, no acute distress or apparent pain, comfortable HEENT - eyes appear clear without discharge or redness Heart/Lungs - cannot examine virtually - observed no evidence of coughing or labored breathing. Abd - cannot examine virtually  Skin - face visible today- no rash Neuro - awake, alert, oriented Psych - not anxious appearing   Results for orders placed or performed in visit on 04/22/23  HM COLONOSCOPY  Result Value Ref Range   HM Colonoscopy See Report (in chart) See Report (in chart), Patient Reported  HM COLONOSCOPY  Result Value Ref Range   HM Colonoscopy See Report (in chart) See Report (in chart), Patient Reported      Assessment & Plan:   Problem List Items Addressed This Visit     ADHD - Primary   Relevant Medications   CONCERTA 18 MG CR tablet (Start on 07/07/2023)   Anxiety    Assessment and Plan    Anxiety ADD Possible adjustment disorder vs depressive disorder  Patient reports low motivation and energy, with some improvement with therapy. Discussed the overlap between depression, anxiety, and ADD symptoms. Currently on Lexapro and Buspar, with previous use of Concerta. Discussed the possibility of switching from Lexapro to Zoloft in the future, depending on the patient's situation. -Continue Lexapro 20mg  daily and  Buspar. 5mg  THREE TIMES A DAY - avoid dose inc - Restart Concerta CR 18mg , 30 pills, he has 14 leftover -Consider switching from Lexapro to Zoloft in the future, depending on patient's situation and response to changes in life circumstances. 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         No orders of the defined types were placed in this encounter.    Meds ordered this encounter  Medications   CONCERTA 18 MG CR tablet  Sig: Take 1 tablet (18 mg total) by mouth daily.    Dispense:  30 tablet    Refill:  0    First fill 07/07/23      Follow up plan: Return if symptoms worsen or fail to improve.   Patient verbalizes understanding with the above medical recommendations including the limitation of remote medical advice.  Specific follow-up and call-back criteria were given for patient to follow-up or seek medical care more urgently if needed.  Total duration of direct patient care provided via video conference: 15 minutes   Saralyn Pilar, DO [facility] 07/06/2023, 4:36 PM

## 2023-07-08 ENCOUNTER — Telehealth: Payer: Self-pay

## 2023-07-08 NOTE — Telephone Encounter (Signed)
Prior authorization for CONCERTA 18MG  tabs submitted today through Cover My Meds. Key: [id number]

## 2023-07-12 ENCOUNTER — Other Ambulatory Visit: Payer: Self-pay | Admitting: Family Medicine

## 2023-07-12 DIAGNOSIS — F902 Attention-deficit hyperactivity disorder, combined type: Secondary | ICD-10-CM

## 2023-07-12 MED ORDER — CONCERTA 18 MG PO TBCR
18.0000 mg | EXTENDED_RELEASE_TABLET | Freq: Every day | ORAL | 0 refills | Status: DC
Start: 2023-07-12 — End: 2023-07-13

## 2023-07-12 NOTE — Addendum Note (Signed)
Addended by: Smitty Cords on: 07/12/2023 07:15 PM   Modules accepted: Orders

## 2023-07-13 MED ORDER — METHYLPHENIDATE HCL ER (OSM) 18 MG PO TBCR
18.0000 mg | EXTENDED_RELEASE_TABLET | Freq: Every day | ORAL | 0 refills | Status: DC
Start: 2023-07-13 — End: 2023-09-17

## 2023-07-13 NOTE — Telephone Encounter (Signed)
Received communication from pharmacy that insurance will only cover generic form. PA already completed for CONCERTA 18mg  and was DENIED.  Requesting generic to be sent in or different medication.

## 2023-07-13 NOTE — Telephone Encounter (Signed)
Insurance DENIED coverage for CONCERTA 18 mg tabs.

## 2023-07-13 NOTE — Addendum Note (Signed)
Addended by: Smitty Cords on: 07/13/2023 12:38 PM   Modules accepted: Orders

## 2023-07-13 NOTE — Telephone Encounter (Signed)
Understood. Switched order to generic Methylphenidate CR 18mg . I notified patient on mychart  Saralyn Pilar, DO [facility] 07/13/2023, 12:39 PM

## 2023-08-02 ENCOUNTER — Encounter: Payer: Self-pay | Admitting: Family Medicine

## 2023-08-25 ENCOUNTER — Encounter: Payer: Self-pay | Admitting: Dermatology

## 2023-08-25 ENCOUNTER — Ambulatory Visit: Payer: 59 | Admitting: Dermatology

## 2023-08-25 DIAGNOSIS — L738 Other specified follicular disorders: Secondary | ICD-10-CM

## 2023-08-25 DIAGNOSIS — L820 Inflamed seborrheic keratosis: Secondary | ICD-10-CM | POA: Diagnosis not present

## 2023-08-25 DIAGNOSIS — Z7189 Other specified counseling: Secondary | ICD-10-CM

## 2023-08-25 DIAGNOSIS — W908XXA Exposure to other nonionizing radiation, initial encounter: Secondary | ICD-10-CM

## 2023-08-25 DIAGNOSIS — Z872 Personal history of diseases of the skin and subcutaneous tissue: Secondary | ICD-10-CM | POA: Diagnosis not present

## 2023-08-25 DIAGNOSIS — L578 Other skin changes due to chronic exposure to nonionizing radiation: Secondary | ICD-10-CM | POA: Diagnosis not present

## 2023-08-25 NOTE — Progress Notes (Signed)
   Follow-Up Visit   Subjective  Ethan Glenn is a 62 y.o. male who presents for the following: AK follow up. Tx left cheek with 5FU 11/12/2022. Repeated treatment, finished 1 week ago. Still has pink area at site.   The patient has spots, moles and lesions to be evaluated, some may be new or changing and the patient may have concern these could be cancer.    The following portions of the chart were reviewed this encounter and updated as appropriate: medications, allergies, medical history  Review of Systems:  No other skin or systemic complaints except as noted in HPI or Assessment and Plan.  Objective  Well appearing patient in no apparent distress; mood and affect are within normal limits.  A focused examination was performed of the following areas: Face, ears, neck  Relevant physical exam findings are noted in the Assessment and Plan.  Left Zygomatic Area x1 Erythematous keratotic or waxy stuck-on papule or plaque.    Assessment & Plan   ACTINIC DAMAGE - chronic, secondary to cumulative UV radiation exposure/sun exposure over time - diffuse scaly erythematous macules with underlying dyspigmentation - Recommend daily broad spectrum sunscreen SPF 30+ to sun-exposed areas, reapply every 2 hours as needed.  - Recommend staying in the shade or wearing long sleeves, sun glasses (UVA+UVB protection) and wide brim hats (4-inch brim around the entire circumference of the hat). - Call for new or changing lesions.  Sebaceous Hyperplasia - Small yellow papules with a central dell - Benign-appearing - Observe. Call for changes.   History of actinic keratoses Status post treatment with field treatment with 5-FU/calcipotriene Currently clear.   Inflamed seborrheic keratosis Left Zygomatic Area x1  Symptomatic, irritating, patient would like treated.  Destruction of lesion - Left Zygomatic Area x1 Complexity: simple   Destruction method: cryotherapy   Informed consent:  discussed and consent obtained   Timeout:  patient name, date of birth, surgical site, and procedure verified Lesion destroyed using liquid nitrogen: Yes   Region frozen until ice ball extended beyond lesion: Yes   Outcome: patient tolerated procedure well with no complications   Post-procedure details: wound care instructions given   Additional details:  Prior to procedure, discussed risks of blister formation, small wound, skin dyspigmentation, or rare scar following cryotherapy. Recommend Vaseline ointment to treated areas while healing.     Return for TBSE As Scheduled, With Dr. Gwen Pounds.  I, Lawson Radar, CMA, am acting as scribe for Armida Sans, MD.   Documentation: I have reviewed the above documentation for accuracy and completeness, and I agree with the above.  Armida Sans, MD

## 2023-08-25 NOTE — Patient Instructions (Signed)

## 2023-09-17 ENCOUNTER — Encounter: Payer: Self-pay | Admitting: Family Medicine

## 2023-09-17 ENCOUNTER — Telehealth (INDEPENDENT_AMBULATORY_CARE_PROVIDER_SITE_OTHER): Payer: 59 | Admitting: Family Medicine

## 2023-09-17 DIAGNOSIS — F419 Anxiety disorder, unspecified: Secondary | ICD-10-CM

## 2023-09-17 DIAGNOSIS — F902 Attention-deficit hyperactivity disorder, combined type: Secondary | ICD-10-CM

## 2023-09-17 DIAGNOSIS — M62838 Other muscle spasm: Secondary | ICD-10-CM | POA: Diagnosis not present

## 2023-09-17 DIAGNOSIS — M542 Cervicalgia: Secondary | ICD-10-CM

## 2023-09-17 DIAGNOSIS — S134XXD Sprain of ligaments of cervical spine, subsequent encounter: Secondary | ICD-10-CM

## 2023-09-17 MED ORDER — BUSPIRONE HCL 5 MG PO TABS
5.0000 mg | ORAL_TABLET | Freq: Three times a day (TID) | ORAL | 2 refills | Status: DC | PRN
Start: 2023-09-17 — End: 2024-02-01
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
## 2023-09-17 NOTE — Patient Instructions (Addendum)
Thank you for coming to the office today.  Start some home stretching therapy exercises If not improving 2-4+ weeks, we can refer to Physical therapy PT   ---------------------  Recommend contacting our office 11/20/23 and request Colonoscopy order.  --------------------  Cervical Strain and Sprain Rehab Ask your health care provider which exercises are safe for you. Do exercises exactly as told by your health care provider and adjust them as directed. It is normal to feel mild stretching, pulling, tightness, or discomfort as you do these exercises. Stop right away if you feel sudden pain or your pain gets worse. Do not begin these exercises until told by your health care provider. Stretching and range-of-motion exercises Cervical side bending  Using good posture, sit on a stable chair or stand up. Without moving your shoulders, slowly tilt your left / right ear to your shoulder until you feel a stretch in the neck muscles on the opposite side. You should be looking straight ahead. Hold for __________ seconds. Repeat with the other side of your neck. Repeat __________ times. Complete this exercise __________ times a day. Cervical rotation  Using good posture, sit on a stable chair or stand up. Slowly turn your head to the side as if you are looking over your left / right shoulder. Keep your eyes level with the ground. Stop when you feel a stretch along the side and the back of your neck. Hold for __________ seconds. Repeat this by turning to your other side. Repeat __________ times. Complete this exercise __________ times a day. Thoracic extension and pectoral stretch  Roll a towel or a small blanket so it is about 4 inches (10 cm) in diameter. Lie down on your back on a firm surface. Put the towel in the middle of your back across your spine. It should not be under your shoulder blades. Put your hands behind your head and let your elbows fall out to your sides. Hold for __________  seconds. Repeat __________ times. Complete this exercise __________ times a day. Strengthening exercises Upper cervical flexion  Lie on your back with a thin pillow behind your head or a small, rolled-up towel under your neck. Gently tuck your chin toward your chest and nod your head down to look toward your feet. Do not lift your head off the pillow. Hold for __________ seconds. Release the tension slowly. Relax your neck muscles completely before you repeat this exercise. Repeat __________ times. Complete this exercise __________ times a day. Cervical extension  Stand about 6 inches (15 cm) away from a wall, with your back facing the wall. Place a soft object, about 6-8 inches (15-20 cm) in diameter, between the back of your head and the wall. A soft object could be a small pillow, a ball, or a folded towel. Gently tilt your head back and press into the soft object. Keep your jaw and forehead relaxed. Hold for __________ seconds. Release the tension slowly. Relax your neck muscles completely before you repeat this exercise. Repeat __________ times. Complete this exercise __________ times a day. Posture and body mechanics Body mechanics refer to the movements and positions of your body while you do your daily activities. Posture is part of body mechanics. Good posture and healthy body mechanics can help to relieve stress in your body's tissues and joints. Good posture means that your spine is in its natural S-curve position (your spine is neutral), your shoulders are pulled back slightly, and your head is not tipped forward. The following are general  guidelines for using improved posture and body mechanics in your everyday activities. Sitting  When sitting, keep your spine neutral and keep your feet flat on the floor. Use a footrest, if needed, and keep your thighs parallel to the floor. Avoid rounding your shoulders. Avoid tilting your head forward. When working at a desk or a computer,  keep your desk at a height where your hands are slightly lower than your elbows. Slide your chair under your desk so you are close enough to maintain good posture. When working at a computer, place your monitor at a height where you are looking straight ahead and you do not have to tilt your head forward or downward to look at the screen. Standing  When standing, keep your spine neutral and keep your feet about hip-width apart. Keep a slight bend in your knees. Your ears, shoulders, and hips should line up. When you do a task in which you stand in one place for a long time, place one foot up on a stable object that is 2-4 inches (5-10 cm) high, such as a footstool. This helps keep your spine neutral. Resting When lying down and resting, avoid positions that are most painful for you. Try to support your neck in a neutral position. You can use a contour pillow or a small rolled-up towel. Your pillow should support your neck but not push on it. This information is not intended to replace advice given to you by your health care provider. Make sure you discuss any questions you have with your health care provider. Document Revised: 01/11/2023 Document Reviewed: 03/30/2022 Elsevier Patient Education  2024 [employer]     Please schedule a Follow-up Appointment to: Return if symptoms worsen or fail to improve.  If you have any other questions or concerns, please feel free to call the office or send a message through MyChart. You may also schedule an earlier appointment if necessary.  Additionally, you may be receiving a survey about your experience at our office within a few days to 1 week by e-mail or mail. We value your feedback.  Saralyn Pilar, DO [facility], [place]

## 2023-11-18 ENCOUNTER — Encounter: Payer: Self-pay | Admitting: Dermatology

## 2023-12-08 ENCOUNTER — Ambulatory Visit: Payer: Self-pay | Admitting: Dermatology

## 2023-12-08 ENCOUNTER — Encounter: Payer: Self-pay | Admitting: Dermatology

## 2023-12-08 DIAGNOSIS — D239 Other benign neoplasm of skin, unspecified: Secondary | ICD-10-CM

## 2023-12-08 DIAGNOSIS — B070 Plantar wart: Secondary | ICD-10-CM | POA: Diagnosis not present

## 2023-12-08 DIAGNOSIS — L578 Other skin changes due to chronic exposure to nonionizing radiation: Secondary | ICD-10-CM

## 2023-12-08 DIAGNOSIS — D2371 Other benign neoplasm of skin of right lower limb, including hip: Secondary | ICD-10-CM

## 2023-12-08 DIAGNOSIS — Z1283 Encounter for screening for malignant neoplasm of skin: Secondary | ICD-10-CM

## 2023-12-08 DIAGNOSIS — T148XXA Other injury of unspecified body region, initial encounter: Secondary | ICD-10-CM

## 2023-12-08 DIAGNOSIS — L814 Other melanin hyperpigmentation: Secondary | ICD-10-CM

## 2023-12-08 DIAGNOSIS — L821 Other seborrheic keratosis: Secondary | ICD-10-CM

## 2023-12-08 DIAGNOSIS — D1801 Hemangioma of skin and subcutaneous tissue: Secondary | ICD-10-CM

## 2023-12-08 DIAGNOSIS — W908XXA Exposure to other nonionizing radiation, initial encounter: Secondary | ICD-10-CM

## 2023-12-08 DIAGNOSIS — S80811A Abrasion, right lower leg, initial encounter: Secondary | ICD-10-CM

## 2023-12-08 DIAGNOSIS — D229 Melanocytic nevi, unspecified: Secondary | ICD-10-CM

## 2023-12-08 NOTE — Progress Notes (Signed)
   Follow-Up Visit   Subjective  Kejon Feild is a 63 y.o. male who presents for the following: Skin Cancer Screening and Full Body Skin Exam  The patient presents for Total-Body Skin Exam (TBSE) for skin cancer screening and mole check. The patient has spots, moles and lesions to be evaluated, some may be new or changing and the patient may have concern these could be cancer.   The following portions of the chart were reviewed this encounter and updated as appropriate: medications, allergies, medical history  Review of Systems:  No other skin or systemic complaints except as noted in HPI or Assessment and Plan.  Objective  Well appearing patient in no apparent distress; mood and affect are within normal limits.  A full examination was performed including scalp, head, eyes, ears, nose, lips, neck, chest, axillae, abdomen, back, buttocks, bilateral upper extremities, bilateral lower extremities, hands, feet, fingers, toes, fingernails, and toenails. All findings within normal limits unless otherwise noted below.   Relevant physical exam findings are noted in the Assessment and Plan.  L plantar foot Verrucous papules -- Discussed viral etiology and contagion.  Assessment & Plan   SKIN CANCER SCREENING PERFORMED TODAY.  ACTINIC DAMAGE - Chronic condition, secondary to cumulative UV/sun exposure - diffuse scaly erythematous macules with underlying dyspigmentation - Recommend daily broad spectrum sunscreen SPF 30+ to sun-exposed areas, reapply every 2 hours as needed.  - Staying in the shade or wearing long sleeves, sun glasses (UVA+UVB protection) and wide brim hats (4-inch brim around the entire circumference of the hat) are also recommended for sun protection.  - Call for new or changing lesions.  LENTIGINES, SEBORRHEIC KERATOSES, HEMANGIOMAS - Benign normal skin lesions - Benign-appearing - Call for any changes  MELANOCYTIC NEVI - Tan-brown and/or pink-flesh-colored symmetric  macules and papules - Benign appearing on exam today - Observation - Call clinic for new or changing moles - Recommend daily use of broad spectrum spf 30+ sunscreen to sun-exposed areas.   DERMATOFIBROMA Exam: Firm pink/brown papulenodule with dimple sign at R thigh Treatment Plan: A dermatofibroma is a benign growth possibly related to trauma, such as an insect bite, cut from shaving, or inflamed acne-type bump.  Treatment options to remove include shave or excision with resulting scar and risk of recurrence.  Since benign-appearing and not bothersome, will observe for now.   EXCORIATION Exam: Excoriation at R shin  Treatment Plan: Recommend vaseline. Call if not resolving.   PLANTAR WART L plantar foot Viral Wart (HPV) Counseling  Discussed viral / HPV (Human Papilloma Virus) etiology and risk of spread /infectivity to other areas of body as well as to other people.  Multiple treatments and methods may be required to clear warts and it is possible treatment may not be successful.  Treatment risks include discoloration; scarring and there is still potential for wart recurrence. MULTIPLE BENIGN NEVI   LENTIGINES   SEBORRHEIC KERATOSES   ACTINIC ELASTOSIS   CHERRY ANGIOMA   DERMATOFIBROMA   EXCORIATION   Return for w/ Dr. Katrinka Blazing, TBSE.  I, Soundra Pilon, CMA, am acting as scribe for Elie Goody, MD .   Documentation: I have reviewed the above documentation for accuracy and completeness, and I agree with the above.  Elie Goody, MD

## 2023-12-08 NOTE — Patient Instructions (Addendum)

## 2024-01-10 ENCOUNTER — Encounter: Payer: Self-pay | Admitting: Family Medicine

## 2024-01-25 ENCOUNTER — Other Ambulatory Visit: Payer: Self-pay | Admitting: Family Medicine

## 2024-01-25 ENCOUNTER — Ambulatory Visit: Admitting: Family Medicine

## 2024-01-25 ENCOUNTER — Encounter: Payer: Self-pay | Admitting: Family Medicine

## 2024-01-25 VITALS — BP 118/70 | HR 70 | Ht 74.0 in | Wt 199.2 lb

## 2024-01-25 DIAGNOSIS — E7800 Pure hypercholesterolemia, unspecified: Secondary | ICD-10-CM

## 2024-01-25 DIAGNOSIS — F902 Attention-deficit hyperactivity disorder, combined type: Secondary | ICD-10-CM

## 2024-01-25 DIAGNOSIS — N4341 Spermatocele of epididymis, single: Secondary | ICD-10-CM | POA: Diagnosis not present

## 2024-01-25 DIAGNOSIS — N401 Enlarged prostate with lower urinary tract symptoms: Secondary | ICD-10-CM

## 2024-01-25 DIAGNOSIS — Z0000 Encounter for general adult medical examination without abnormal findings: Secondary | ICD-10-CM

## 2024-01-25 DIAGNOSIS — R7309 Other abnormal glucose: Secondary | ICD-10-CM

## 2024-01-25 NOTE — Progress Notes (Signed)
 Subjective:    Patient ID: Ethan Glenn, male    DOB: 30-Sep-1960, 63 y.o.   MRN: [id number]  Ethan Glenn is a 63 y.o. male presenting on 01/25/2024 for Spermatocele, Scrotal Pain   HPI  Discussed the use of AI scribe software for clinical note transcription with the patient, who gave verbal consent to proceed.  History of Present Illness   Ethan Dicecco "Athena Bland" is a 63 year old male with a history of spermatocele who presents with worsening groin pain.  He has been experiencing groin pain associated with a known spermatocele, originally diagnosed 11/2021 here after evaluation and scrotal US  imaging. Also found varicoceles. See result below.  He has not had Urology consultation.  Now, the pain has become more frequent and intense over past 2-3 months, compared to previous years, increasing from a mild one or two out of ten to a four or five out of ten, particularly after physical activities such as gardening, which involves bending and crouching. The pain is described as feeling like 'somebody thumped me' and is exacerbated by wearing thicker work pants during outdoor activities. The pain radiates upwards from the groin area and is relieved by taking ibuprofen and applying ice packs.  He has noticed that the spermatocele feels slightly larger and more tender than before.  He has a history of varicocele, identified during his college years, which was not considered problematic at the time. He has been managing the symptoms by wearing looser clothing, which he finds helps reduce discomfort.  He is active, engaging in gardening and gym activities. No issues with the testicular area lower down, and no problems on the other side of the groin. No other systemic symptoms reported.          01/25/2024   10:14 AM 07/06/2023    6:28 PM 03/18/2023    9:46 AM  Depression screen PHQ 2/9  Decreased Interest 0 1 0  Down, Depressed, Hopeless 0 0 0  PHQ - 2 Score 0 1 0  Altered sleeping  0 0   Tired, decreased energy  2 0  Change in appetite  0 0  Feeling bad or failure about yourself   0 0  Trouble concentrating  1 0  Moving slowly or fidgety/restless  0 0  Suicidal thoughts  0 0  PHQ-9 Score  4 0  Difficult doing work/chores  Not difficult at all Not difficult at all       01/25/2024   10:14 AM 07/06/2023    6:28 PM 03/18/2023    9:47 AM 09/02/2022   11:39 AM  GAD 7 : Generalized Anxiety Score  Nervous, Anxious, on Edge 0 1 0 1  Control/stop worrying 0 0 0 1  Worry too much - different things 0 0 0 1  Trouble relaxing 0 1 0 2  Restless 0 0 0 2  Easily annoyed or irritable 0 1 0 1  Afraid - awful might happen 0 0 0 0  Total GAD 7 Score 0 3 0 8  Anxiety Difficulty  Not difficult at all Not difficult at all Somewhat difficult    Social History   Tobacco Use   Smoking status: Former    Current packs/day: 0.00    Types: Cigarettes    Quit date: 08/07/1986    Years since quitting: 37.4   Smokeless tobacco: Never  Vaping Use   Vaping status: Never Used  Substance Use Topics   Alcohol use: Yes    Alcohol/week: 3.0 standard  drinks of alcohol    Types: 3 Standard drinks or equivalent per week   Drug use: Never    Review of Systems Per HPI unless specifically indicated above     Objective:    BP 118/70 (BP Location: Left Arm, Patient Position: Sitting, Cuff Size: Normal)   Pulse 70   Ht 6\' 2"  (1.88 m)   Wt 199 lb 4 oz (90.4 kg)   SpO2 98%   BMI 25.58 kg/m   Wt Readings from Last 3 Encounters:  01/25/24 199 lb 4 oz (90.4 kg)  03/18/23 196 lb (88.9 kg)  09/28/22 201 lb (91.2 kg)    Physical Exam Vitals and nursing note reviewed.  Constitutional:      General: He is not in acute distress.    Appearance: Normal appearance. He is well-developed. He is not diaphoretic.     Comments: Well-appearing, comfortable, cooperative  HENT:     Head: Normocephalic and atraumatic.  Eyes:     General:        Right eye: No discharge.        Left eye: No  discharge.     Conjunctiva/sclera: Conjunctivae normal.  Cardiovascular:     Rate and Rhythm: Normal rate.  Pulmonary:     Effort: Pulmonary effort is normal.  Genitourinary:    Comments: External Genital Male Exam Palpable non inflamed and non tender moderate sized varicocele Left side.   R side above testicle with large now 2 cm mobile firm cystic mass palpated, in area suspect spermatocele. Non tender.   Testicles themselves are normal.  No palpable herniation provoked on exam Skin:    General: Skin is warm and dry.     Findings: No erythema or rash.  Neurological:     Mental Status: He is alert and oriented to person, place, and time.  Psychiatric:        Mood and Affect: Mood normal.        Behavior: Behavior normal.        Thought Content: Thought content normal.     Comments: Well groomed, good eye contact, normal speech and thoughts    Scrotal US  imaging from 2 years ago  I have personally reviewed the radiology report from 12/05/21 on US  Scrotum.  CLINICAL DATA:  Scrotal pain and mass.   EXAM: SCROTAL ULTRASOUND   DOPPLER ULTRASOUND OF THE TESTICLES   TECHNIQUE: Complete ultrasound examination of the testicles, epididymis, and other scrotal structures was performed. Color and spectral Doppler ultrasound were also utilized to evaluate blood flow to the testicles.   COMPARISON:  No prior.   FINDINGS: Right testicle   Measurements: 4.3 x 2.6 x 4.0 cm. No mass or microlithiasis visualized.   Left testicle   Measurements: 4.9 x 2.1 x 2.8 cm. No mass or microlithiasis visualized.   Right epididymis:  2.6 cm cystic structure possibly a spermatocele.   Left epididymis:  Normal in size and appearance.   Hydrocele:  None visualized.   Varicocele:  Bilateral varicoceles left side greater than right.   Pulsed Doppler interrogation of both testes demonstrates normal low resistance arterial and venous waveforms bilaterally.   IMPRESSION: 1. 2.6 cm cystic  structure adjacent to the right epididymis, possibly a spermatocele.   2.  Bilateral varicoceles left side greater than right.   3.  No evidence of testicular mass or torsion.     Electronically Signed   By: Andy Bannister  Register M.D.   On: 12/09/2021 10:15  Results for orders placed  or performed in visit on 04/22/23  HM COLONOSCOPY   Collection Time: 10/10/18 12:00 AM  Result Value Ref Range   HM Colonoscopy See Report (in chart) See Report (in chart), Patient Reported  HM COLONOSCOPY   Collection Time: 04/03/19 12:00 AM  Result Value Ref Range   HM Colonoscopy See Report (in chart) See Report (in chart), Patient Reported      Assessment & Plan:   Problem List Items Addressed This Visit   None Visit Diagnoses       Single spermatocele of right epididymis    -  Primary   Relevant Orders   Ambulatory referral to Urology        R Spermatocele with groin pain Chronic spermatocele, original dx 11/2021 now 2-3 months with increasing groin pain, exacerbated by activity and tight clothing. Enlarged from 1.5 cm to 2 cm on my exam.  Urological consultation recommended for potential procedural options. - Refer to urology for evaluation and management. - Advise wearing loose clothing. - Consider aspiration or procedural intervention if recommended by urology.  Follow-up Follow-up plans discussed for ongoing management. - Schedule fasting lab work for March 14, 2024, at 8 AM. - Confirm wellness checkup on March 20, 2024, at 3:20 PM.       Orders Placed This Encounter  Procedures   Ambulatory referral to Urology    Referral Priority:   Routine    Referral Type:   Consultation    Referral Reason:   Specialty Services Required    Requested Specialty:   Urology    Number of Visits Requested:   1    No orders of the defined types were placed in this encounter.   Follow up plan: Return if symptoms worsen or fail to improve.  Future labs ordered for 03/14/24  Domingo Friend, DO [facility] 01/25/2024, 10:20 AM

## 2024-01-25 NOTE — Patient Instructions (Addendum)
 Thank you for coming to the office today.  For the enlarging R spermatocele with increasing symptoms.   Referral to Dr Jay Meth  [facility] -1st floor [address] [place],  [place]  [address] Phone: [phone]    Please schedule a Follow-up Appointment to: Return if symptoms worsen or fail to improve.  If you have any other questions or concerns, please feel free to call the office or send a message through MyChart. You may also schedule an earlier appointment if necessary.  Additionally, you may be receiving a survey about your experience at our office within a few days to 1 week by e-mail or mail. We value your feedback.  Domingo Friend, DO [facility], [place]

## 2024-02-01 ENCOUNTER — Ambulatory Visit (INDEPENDENT_AMBULATORY_CARE_PROVIDER_SITE_OTHER): Admitting: Urology

## 2024-02-01 VITALS — BP 151/74 | HR 88 | Ht 74.0 in | Wt 199.2 lb

## 2024-02-01 DIAGNOSIS — N4340 Spermatocele of epididymis, unspecified: Secondary | ICD-10-CM

## 2024-02-01 NOTE — Progress Notes (Signed)
 02/01/2024 4:27 PM   Dennard Fisher 10-Mar-1961 [id number]  Referring provider: Raina Bunting, DO [address] [place],  [place] [address]  No chief complaint on file.   HPI: 63 year-old male with a personal history of spermatocele, diagnosed in 2023, and rheumatoid arthritis presents today with concerns about a cyst in his scrotum that has become more symptomatic.   He first noticed the cyst a couple of years ago during a visit with Dr. Romeo Co. It was initially asymptomatic, but after engaging in gardening activities this spring, which involved crouching and bending, he began experiencing pain radiating up his side. The pain is exacerbated by activities such as driving and sitting for extended periods, which cause tenderness.   He denies any bulging in the groin or concerns for a hernia, although he had a hernia corrected in infancy.   He performs monthly self-examinations and noticed the cyst had grown, confirmed by Dr. Linnell Richardson through measurements and an ultrasound.   He has not had a vasectomy.   He is an avid gardener and has been lifting weights more frequently since December, which he suspects may be related to his symptoms. He has a congenital aortic hernia and is advised by his cardiologist to avoid straining during workouts.   PMH: Past Medical History:  Diagnosis Date   ADHD    Allergic rhinitis    Anxiety    Frequent headaches    Migraine     Surgical History: Past Surgical History:  Procedure Laterality Date   HERNIA REPAIR  1965   umbilical   KNEE SURGERY Left 1984   knee arthroscopic meniscectomy    Home Medications:  Allergies as of 02/01/2024       Reactions   Erythromycin Nausea Only   Quinolones Other (See Comments)   Aortic Aneurysm - should avoid Fluoroquinolone medications.        Medication List        Accurate as of Feb 01, 2024  4:27 PM. If you have any questions, ask your nurse or doctor.          STOP taking these  medications    busPIRone  5 MG tablet Commonly known as: BUSPAR    cyclobenzaprine  10 MG tablet Commonly known as: FLEXERIL    ketoconazole  2 % shampoo Commonly known as: NIZORAL    mometasone  0.1 % lotion Commonly known as: ELOCON        TAKE these medications    escitalopram  20 MG tablet Commonly known as: LEXAPRO  Take 1 tablet (20 mg total) by mouth daily.   loratadine 10 MG tablet Commonly known as: CLARITIN Take 10 mg by mouth daily.   PRESERVISION AREDS PO Take by mouth daily.   SUMAtriptan  20 MG/ACT nasal spray Commonly known as: IMITREX  PLACE 1 SPRAY (20 MG TOTAL) INTO THE NOSE EVERY 2 (TWO) HOURS AS NEEDED FOR MIGRAINE OR HEADACHE. MAY REPEAT IN 2 HOURS IF HEADACHE PERSISTS OR RECURS.   tamsulosin  0.4 MG Caps capsule Commonly known as: FLOMAX  TAKE 1 CAPSULE BY MOUTH EVERY DAY AFTER SUPPER        Allergies:  Allergies  Allergen Reactions   Erythromycin Nausea Only   Quinolones Other (See Comments)    Aortic Aneurysm - should avoid Fluoroquinolone medications.    Family History: Family History  Problem Relation Age of Onset   Glaucoma Mother    Ovarian cancer Mother    Hypertension Father    Stroke Father    Anxiety disorder Sister     Social History:  reports that he quit smoking about 37 years ago. His smoking use included cigarettes. He has never used smokeless tobacco. He reports current alcohol use of about 3.0 standard drinks of alcohol per week. He reports that he does not use drugs.   Physical Exam: BP (!) 151/74   Pulse 88   Ht 6\' 2"  (1.88 m)   Wt 199 lb 4 oz (90.4 kg)   BMI 25.58 kg/m   Constitutional:  Alert and oriented, No acute distress. HEENT: [place] AT, moist mucus membranes.  Trachea midline, no masses. GU: spermatocele Neurologic: Grossly intact, no focal deficits, moving all 4 extremities. Psychiatric: Normal mood and affect.   Assessment & Plan:    1. Spermatocele - Explained it is a cyst arising from the epididymis. He  has had an ultrasound confirming the diagnosis. Explained that the spermatocele is not cancerous or precancerous and discussed treatment options, including conservative management and surgical removal if symptoms persist.   - He prefers a conservative approach, including lifestyle modifications such as wearing tighter underwear to reduce movement and discomfort. He was advised against loose garments and suggested trying compression shorts. He will monitor symptoms and consider surgical intervention if conservative measures fail.  2. Possible Hernia - Pain radiating to the side, which could be related to a sports hernia or inguinal ring inflammation due to physical activities. Did not detect a hernia on examination but acknowledged the possibility of a sports hernia. Advised to modify physical activities, such as using lighter weights with more repetitions, to reduce strain. He will monitor symptoms and return for further evaluation if pain persists.  Return in about 3 months (around 05/03/2024), or if symptoms worsen or fail to improve.  [facility] [address], [address] [place], [place] [address] [phone]

## 2024-02-03 ENCOUNTER — Encounter: Payer: Self-pay | Admitting: Urology

## 2024-02-03 DIAGNOSIS — N4340 Spermatocele of epididymis, unspecified: Secondary | ICD-10-CM

## 2024-02-15 ENCOUNTER — Telehealth: Payer: Self-pay

## 2024-02-15 ENCOUNTER — Other Ambulatory Visit: Payer: Self-pay

## 2024-02-15 DIAGNOSIS — N4340 Spermatocele of epididymis, unspecified: Secondary | ICD-10-CM

## 2024-02-15 NOTE — Progress Notes (Signed)
 Surgical Physician Order Form [facility]  Dr. Dustin Gimenez, MD  * Scheduling expectation : Next Available  *Length of Case:   *Clearance needed: no  *Anticoagulation Instructions: Hold all anticoagulants  *Aspirin Instructions: Hold Aspirin  *Post-op visit Date/Instructions:  4-6 weeks post op  *Diagnosis: right spermatocele  *Procedure: right spermatocelectomy   Additional orders: N/A  -Admit type: OUTpatient  -Anesthesia: General  -VTE Prophylaxis Standing Order SCD's       Other:   -Standing Lab Orders Per Anesthesia    Lab other: None  -Standing Test orders EKG/Chest x-ray per Anesthesia       Test other:   - Medications:  Ancef 2gm IV  -Other orders:  N/A

## 2024-02-15 NOTE — Progress Notes (Signed)
 Patient would like to wait until Summer of 2026 for surgery with Dr. Estanislao Heimlich, made follow up appt and ultrasound order for follow up- Will tentatively plan on a June 2026 Surgery date.

## 2024-02-15 NOTE — Telephone Encounter (Signed)
 Patient would like to wait until Summer of 2026 for surgery with Dr. Estanislao Heimlich, made follow up appt and ultrasound order for follow up- Will tentatively plan on a June 2026 Surgery date.

## 2024-02-23 ENCOUNTER — Encounter: Payer: Self-pay | Admitting: Family Medicine

## 2024-02-23 DIAGNOSIS — G43909 Migraine, unspecified, not intractable, without status migrainosus: Secondary | ICD-10-CM

## 2024-02-23 MED ORDER — SUMATRIPTAN 20 MG/ACT NA SOLN
20.0000 mg | NASAL | 2 refills | Status: DC | PRN
Start: 2024-02-23 — End: 2024-02-23

## 2024-02-23 MED ORDER — SUMATRIPTAN 20 MG/ACT NA SOLN: 20.0000 mg | Freq: Every day | NASAL | 2 refills | Status: AC | PRN

## 2024-02-25 ENCOUNTER — Encounter: Payer: Self-pay | Admitting: Family Medicine

## 2024-02-25 DIAGNOSIS — Z1211 Encounter for screening for malignant neoplasm of colon: Secondary | ICD-10-CM

## 2024-02-25 NOTE — Addendum Note (Signed)
 Addended by: Carollynn Cirri on: 02/25/2024 02:01 PM   Modules accepted: Orders

## 2024-03-07 ENCOUNTER — Encounter: Payer: Self-pay | Admitting: Family Medicine

## 2024-03-14 ENCOUNTER — Other Ambulatory Visit

## 2024-03-14 DIAGNOSIS — N401 Enlarged prostate with lower urinary tract symptoms: Secondary | ICD-10-CM

## 2024-03-14 DIAGNOSIS — Z0000 Encounter for general adult medical examination without abnormal findings: Secondary | ICD-10-CM

## 2024-03-14 DIAGNOSIS — R7309 Other abnormal glucose: Secondary | ICD-10-CM

## 2024-03-14 DIAGNOSIS — F902 Attention-deficit hyperactivity disorder, combined type: Secondary | ICD-10-CM

## 2024-03-14 DIAGNOSIS — E7800 Pure hypercholesterolemia, unspecified: Secondary | ICD-10-CM

## 2024-03-15 ENCOUNTER — Encounter: Payer: Self-pay | Admitting: Family Medicine

## 2024-03-15 LAB — CBC WITH DIFFERENTIAL/PLATELET
Absolute Lymphocytes: 1580 {cells}/uL (ref 850–3900)
Absolute Monocytes: 531 {cells}/uL (ref 200–950)
Basophils Absolute: 31 {cells}/uL (ref 0–200)
Basophils Relative: 0.5 %
Eosinophils Absolute: 207 {cells}/uL (ref 15–500)
Eosinophils Relative: 3.4 %
HCT: 42.7 % (ref 38.5–50.0)
Hemoglobin: 14 g/dL (ref 13.2–17.1)
MCH: 30.4 pg (ref 27.0–33.0)
MCHC: 32.8 g/dL (ref 32.0–36.0)
MCV: 92.6 fL (ref 80.0–100.0)
MPV: 10.2 fL (ref 7.5–12.5)
Monocytes Relative: 8.7 %
Neutro Abs: 3752 {cells}/uL (ref 1500–7800)
Neutrophils Relative %: 61.5 %
Platelets: 300 10*3/uL (ref 140–400)
RBC: 4.61 10*6/uL (ref 4.20–5.80)
RDW: 12.6 % (ref 11.0–15.0)
Total Lymphocyte: 25.9 %
WBC: 6.1 10*3/uL (ref 3.8–10.8)

## 2024-03-15 LAB — LIPID PANEL
Cholesterol: 203 mg/dL — ABNORMAL HIGH (ref <200)
HDL: 71 mg/dL (ref >=40)
LDL Cholesterol (Calc): 116 mg/dL — ABNORMAL HIGH
Non-HDL Cholesterol (Calc): 132 mg/dL — ABNORMAL HIGH (ref <130)
Total CHOL/HDL Ratio: 2.9 (calc) (ref <5.0)
Triglycerides: 66 mg/dL (ref <150)

## 2024-03-15 LAB — COMPREHENSIVE METABOLIC PANEL WITH GFR
AG Ratio: 1.8 (calc) (ref 1.0–2.5)
ALT: 18 U/L (ref 9–46)
AST: 20 U/L (ref 10–35)
Albumin: 4.3 g/dL (ref 3.6–5.1)
Alkaline phosphatase (APISO): 84 U/L (ref 35–144)
BUN/Creatinine Ratio: 9 (calc) (ref 6–22)
BUN: 12 mg/dL (ref 7–25)
CO2: 28 mmol/L (ref 20–32)
Calcium: 10 mg/dL (ref 8.6–10.3)
Chloride: 103 mmol/L (ref 98–110)
Creat: 1.38 mg/dL — ABNORMAL HIGH (ref 0.70–1.35)
Globulin: 2.4 g/dL (ref 1.9–3.7)
Glucose, Bld: 90 mg/dL (ref 65–99)
Potassium: 4.3 mmol/L (ref 3.5–5.3)
Sodium: 136 mmol/L (ref 135–146)
Total Bilirubin: 0.7 mg/dL (ref 0.2–1.2)
Total Protein: 6.7 g/dL (ref 6.1–8.1)
eGFR: 59 mL/min/{1.73_m2} — ABNORMAL LOW (ref >=60)

## 2024-03-15 LAB — HEMOGLOBIN A1C
Hgb A1c MFr Bld: 5.3 % (ref <5.7)
Mean Plasma Glucose: 105 mg/dL
eAG (mmol/L): 5.8 mmol/L

## 2024-03-15 LAB — PSA: PSA: 1.4 ng/mL (ref <=4.00)

## 2024-03-16 ENCOUNTER — Ambulatory Visit: Admitting: Urology

## 2024-03-18 ENCOUNTER — Other Ambulatory Visit: Payer: Self-pay | Admitting: Family Medicine

## 2024-03-18 DIAGNOSIS — N401 Enlarged prostate with lower urinary tract symptoms: Secondary | ICD-10-CM

## 2024-03-20 ENCOUNTER — Encounter: Payer: Self-pay | Admitting: Family Medicine

## 2024-03-20 ENCOUNTER — Ambulatory Visit (INDEPENDENT_AMBULATORY_CARE_PROVIDER_SITE_OTHER): Admitting: Family Medicine

## 2024-03-20 ENCOUNTER — Other Ambulatory Visit: Payer: Self-pay | Admitting: Family Medicine

## 2024-03-20 ENCOUNTER — Ambulatory Visit: Admitting: Family Medicine

## 2024-03-20 VITALS — BP 128/72 | HR 77 | Ht 74.0 in | Wt 201.2 lb

## 2024-03-20 DIAGNOSIS — R7989 Other specified abnormal findings of blood chemistry: Secondary | ICD-10-CM

## 2024-03-20 DIAGNOSIS — F419 Anxiety disorder, unspecified: Secondary | ICD-10-CM | POA: Diagnosis not present

## 2024-03-20 DIAGNOSIS — E7800 Pure hypercholesterolemia, unspecified: Secondary | ICD-10-CM

## 2024-03-20 DIAGNOSIS — F902 Attention-deficit hyperactivity disorder, combined type: Secondary | ICD-10-CM | POA: Diagnosis not present

## 2024-03-20 DIAGNOSIS — R351 Nocturia: Secondary | ICD-10-CM

## 2024-03-20 DIAGNOSIS — N401 Enlarged prostate with lower urinary tract symptoms: Secondary | ICD-10-CM | POA: Diagnosis not present

## 2024-03-20 DIAGNOSIS — Z0000 Encounter for general adult medical examination without abnormal findings: Secondary | ICD-10-CM | POA: Diagnosis not present

## 2024-03-20 MED ORDER — ESCITALOPRAM OXALATE 20 MG PO TABS: 20.0000 mg | ORAL_TABLET | Freq: Every day | ORAL | 3 refills | Status: AC

## 2024-03-20 MED ORDER — TAMSULOSIN HCL 0.4 MG PO CAPS: 0.4000 mg | ORAL_CAPSULE | Freq: Every day | ORAL | 3 refills | Status: AC

## 2024-03-20 NOTE — Patient Instructions (Addendum)
 Thank you for coming to the office today.  Call [facility] - ask status of the GI referral, and find out approx timeline. If not satisfactory, let me know message and we can switch to [facility] [address] [place], [place] [address] Hours: 8AM-5PM Phone: [phone]  Check into Prevnar-20 vaccine for pneumonia, age 61 and older reduced down to 50+ check into this.  DUE for FASTING BLOOD WORK (no food or drink after midnight before the lab appointment, only water or coffee without cream/sugar on the morning of)  - Make sure Lab Only appointment is at about 1 week before your next appointment, so that results will be available  For Lab Results, once available within 2-3 days of blood draw, you can can log in to MyChart online to view your results and a brief explanation. Also, we can discuss results at next follow-up visit.   Please schedule a Follow-up Appointment to: Return in about 1 year (around 03/20/2025) for 1 year fasting lab > 1 week later Annual Physical.  If you have any other questions or concerns, please feel free to call the office or send a message through MyChart. You may also schedule an earlier appointment if necessary.  Additionally, you may be receiving a survey about your experience at our office within a few days to 1 week by e-mail or mail. We value your feedback.  Marsa Officer, DO [facility], [place]

## 2024-03-20 NOTE — Progress Notes (Signed)
 Subjective:    Patient ID: Ethan Glenn, male    DOB: 1961-07-12, 63 y.o.   MRN: [id number]  Ethan Glenn is a 63 y.o. male presenting on 03/20/2024 for Annual Exam   HPI  Discussed the use of AI scribe software for clinical note transcription with the patient, who gave verbal consent to proceed.  History of Present Illness   Ethan Glenn is a 63 year old male who presents for an annual physical exam.  General health maintenance - No significant symptoms or concerns at this time - No recent illnesses - Actively managing health with regular gym visits and increased muscle mass through weightlifting - Feels better than he has in a long time - Preparing to return to work in July and considering timing of vaccinations accordingly - Maintaining a healthy weight  Renal function abnormality - Elevated creatinine levels noted in previous laboratory results - Advised to maintain adequate hydration to support kidney function Thought due to increased protein creatine supplement.  Hyperlipidemia - Cholesterol levels improved from 135 to 116 mg/dL - Improvements attributed to dietary changes, including reduction of fried foods and alcohol consumption - Cardiovascular risk score is 6.8% and actively working to lower further  Prostatic laboratory abnormality - Elevated PSA levels, stable over the past year - Annual monitoring of PSA levels  Lower back pain and spermatocele - Lower back pain present - Suspects correlation between back pain and spermatocele - Considering further evaluation for these symptoms  Medication management - Currently taking Flomax  (tamsulosin ) after dinner - Currently taking Lexapro  (escitalopram ) 20 mg daily for anxiety. He is off Concerta  for ADHD      Family history Cardiovascular Disease Fam hx Brother 4 years older, age 26, was diagnosed with a mild heart attack history Sister 1 year older, she has had some prior heart problems. She went to  Cardiologist and advised 99% risk of heart attack. Of 6 siblings, he has had done well with both his genetic history and lifestyle. Significant family history with his father and his paternal grandfather with heart attack in past.  He has had prior Coronary Calcium Score in 2022 on CT, low risk.   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 - Referred to [facility] previously but waiting on scheduling still  PSA 1.40, negative, prior 1.07      03/20/2024    3:26 PM 01/25/2024   10:14 AM 07/06/2023    6:28 PM  Depression screen PHQ 2/9  Decreased Interest 0 0 1  Down, Depressed, Hopeless 1 0 0  PHQ - 2 Score 1 0 1  Altered sleeping 1  0  Tired, decreased energy 0  2  Change in appetite 0  0  Feeling bad or failure about yourself  0  0  Trouble concentrating 1  1  Moving slowly or fidgety/restless 0  0  Suicidal thoughts 0  0  PHQ-9 Score 3  4  Difficult doing work/chores Not difficult at all  Not difficult at all       03/20/2024    3:26 PM 01/25/2024   10:14 AM 07/06/2023  6:28 PM 03/18/2023    9:47 AM  GAD 7 : Generalized Anxiety Score  Nervous, Anxious, on Edge 0 0 1 0  Control/stop worrying 0 0 0 0  Worry too much - different things 0 0 0 0  Trouble relaxing 0 0 1 0  Restless 0 0 0 0  Easily annoyed or irritable 0 0 1 0  Afraid - awful might happen 0 0 0 0  Total GAD 7 Score 0 0 3 0  Anxiety Difficulty Not difficult at all  Not difficult at all Not difficult at all     Past Medical History:  Diagnosis Date   ADHD    Allergic rhinitis    Anxiety    Frequent headaches    Migraine    Past Surgical History:   Procedure Laterality Date   HERNIA REPAIR  1965   umbilical   KNEE SURGERY Left 1984   knee arthroscopic meniscectomy   Social History   Socioeconomic History   Marital status: Married    Spouse name: Not on file   Number of children: Not on file   Years of education: Not on file   Highest education level: Master's degree (e.g., MA, MS, MEng, MEd, MSW, MBA)  Occupational History   Not on file  Tobacco Use   Smoking status: Former    Current packs/day: 0.00    Types: Cigarettes    Quit date: 08/07/1986    Years since quitting: 37.6   Smokeless tobacco: Never  Vaping Use   Vaping status: Never Used  Substance and Sexual Activity   Alcohol use: Yes    Alcohol/week: 3.0 standard drinks of alcohol    Types: 3 Standard drinks or equivalent per week   Drug use: Never   Sexual activity: Not on file  Other Topics Concern   Not on file  Social History Narrative   Not on file   Social Drivers of Health   Financial Resource Strain: Low Risk  (03/16/2024)   Overall Financial Resource Strain (CARDIA)    Difficulty of Paying Living Expenses: Not hard at all  Food Insecurity: No Food Insecurity (03/16/2024)   Hunger Vital Sign    Worried About Running Out of Food in the Last Year: Never true    Ran Out of Food in the Last Year: Never true  Transportation Needs: No Transportation Needs (03/16/2024)   PRAPARE - Administrator, Civil Service (Medical): No    Lack of Transportation (Non-Medical): No  Physical Activity: Sufficiently Active (03/16/2024)   Exercise Vital Sign    Days of Exercise per Week: 4 days    Minutes of Exercise per Session: 70 min  Stress: No Stress Concern Present (03/16/2024)   [employer] of Occupational Health - Occupational Stress Questionnaire    Feeling of Stress: Only a little  Social Connections: Moderately Isolated (03/16/2024)   Social Connection and Isolation Panel    Frequency of Communication with Friends and Family: Once a week     Frequency of Social Gatherings with Friends and Family: Once a week    Attends Religious Services: Never    Database administrator or Organizations: Yes    Attends Engineer, structural: More than 4 times per year    Marital Status: Married  Catering manager Violence: Not on file   Family History  Problem Relation Age of Onset   Glaucoma Mother    Ovarian cancer Mother    Hypertension Father    Stroke  Father    Anxiety disorder Sister    Current Outpatient Medications on File Prior to Visit  Medication Sig   loratadine (CLARITIN) 10 MG tablet Take 10 mg by mouth daily.   Multiple Vitamins-Minerals (PRESERVISION AREDS PO) Take by mouth daily.   SUMAtriptan  (IMITREX ) 20 MG/ACT nasal spray Place 1 spray (20 mg total) into the nose daily as needed for migraine or headache. May repeat in 2 hours if headache persists or recurs.   No current facility-administered medications on file prior to visit.    Review of Systems  Constitutional:  Negative for activity change, appetite change, chills, diaphoresis, fatigue and fever.  HENT:  Negative for congestion and hearing loss.   Eyes:  Negative for visual disturbance.  Respiratory:  Negative for cough, chest tightness, shortness of breath and wheezing.   Cardiovascular:  Negative for chest pain, palpitations and leg swelling.  Gastrointestinal:  Negative for abdominal pain, constipation, diarrhea, nausea and vomiting.  Genitourinary:  Negative for dysuria, frequency and hematuria.  Musculoskeletal:  Negative for arthralgias and neck pain.  Skin:  Negative for rash.  Neurological:  Negative for dizziness, weakness, light-headedness, numbness and headaches.  Hematological:  Negative for adenopathy.  Psychiatric/Behavioral:  Negative for behavioral problems, dysphoric mood and sleep disturbance.    Per HPI unless specifically indicated above     Objective:    BP 128/72 (BP Location: Right Arm, Patient Position: Sitting, Cuff Size:  Normal)   Pulse 77   Ht 6' 2 (1.88 m)   Wt 201 lb 3.2 oz (91.3 kg)   SpO2 99%   BMI 25.83 kg/m   Wt Readings from Last 3 Encounters:  03/20/24 201 lb 3.2 oz (91.3 kg)  02/01/24 199 lb 4 oz (90.4 kg)  01/25/24 199 lb 4 oz (90.4 kg)    Physical Exam Vitals and nursing note reviewed.  Constitutional:      General: He is not in acute distress.    Appearance: He is well-developed. He is not diaphoretic.     Comments: Well-appearing, comfortable, cooperative  HENT:     Head: Normocephalic and atraumatic.     Right Ear: Tympanic membrane, ear canal and external ear normal. There is no impacted cerumen.     Left Ear: Tympanic membrane, ear canal and external ear normal. There is no impacted cerumen.   Eyes:     General:        Right eye: No discharge.        Left eye: No discharge.     Conjunctiva/sclera: Conjunctivae normal.     Pupils: Pupils are equal, round, and reactive to light.   Neck:     Thyroid: No thyromegaly.     Vascular: No carotid bruit.   Cardiovascular:     Rate and Rhythm: Normal rate and regular rhythm.     Pulses: Normal pulses.     Heart sounds: Normal heart sounds. No murmur heard. Pulmonary:     Effort: Pulmonary effort is normal. No respiratory distress.     Breath sounds: Normal breath sounds. No wheezing or rales.  Abdominal:     General: Bowel sounds are normal. There is no distension.     Palpations: Abdomen is soft. There is no mass.     Tenderness: There is no abdominal tenderness.   Musculoskeletal:        General: No tenderness. Normal range of motion.     Cervical back: Normal range of motion and neck supple.     Right lower  leg: No edema.     Left lower leg: No edema.     Comments: Upper / Lower Extremities: - Normal muscle tone, strength bilateral upper extremities 5/5, lower extremities 5/5  Lymphadenopathy:     Cervical: No cervical adenopathy.   Skin:    General: Skin is warm and dry.     Findings: No erythema or rash.    Neurological:     Mental Status: He is alert and oriented to person, place, and time.     Comments: Distal sensation intact to light touch all extremities  Psychiatric:        Mood and Affect: Mood normal.        Behavior: Behavior normal.        Thought Content: Thought content normal.     Comments: Well groomed, good eye contact, normal speech and thoughts     Results for orders placed or performed in visit on 03/14/24  Comprehensive metabolic panel with GFR   Collection Time: 03/14/24  8:06 AM  Result Value Ref Range   Glucose, Bld 90 65 - 99 mg/dL   BUN 12 7 - 25 mg/dL   Creat 8.61 (H) 9.29 - 1.35 mg/dL   eGFR 59 (L) > OR = 60 mL/min/1.35m2   BUN/Creatinine Ratio 9 6 - 22 (calc)   Sodium 136 135 - 146 mmol/L   Potassium 4.3 3.5 - 5.3 mmol/L   Chloride 103 98 - 110 mmol/L   CO2 28 20 - 32 mmol/L   Calcium 10.0 8.6 - 10.3 mg/dL   Total Protein 6.7 6.1 - 8.1 g/dL   Albumin 4.3 3.6 - 5.1 g/dL   Globulin 2.4 1.9 - 3.7 g/dL (calc)   AG Ratio 1.8 1.0 - 2.5 (calc)   Total Bilirubin 0.7 0.2 - 1.2 mg/dL   Alkaline phosphatase (APISO) 84 35 - 144 U/L   AST 20 10 - 35 U/L   ALT 18 9 - 46 U/L  PSA   Collection Time: 03/14/24  8:06 AM  Result Value Ref Range   PSA 1.40 < OR = 4.00 ng/mL  CBC with Differential/Platelet   Collection Time: 03/14/24  8:06 AM  Result Value Ref Range   WBC 6.1 3.8 - 10.8 Thousand/uL   RBC 4.61 4.20 - 5.80 Million/uL   Hemoglobin 14.0 13.2 - 17.1 g/dL   HCT 57.2 61.4 - 49.9 %   MCV 92.6 80.0 - 100.0 fL   MCH 30.4 27.0 - 33.0 pg   MCHC 32.8 32.0 - 36.0 g/dL   RDW 87.3 88.9 - 84.9 %   Platelets 300 140 - 400 Thousand/uL   MPV 10.2 7.5 - 12.5 fL   Neutro Abs 3,752 1,500 - 7,800 cells/uL   Absolute Lymphocytes 1,580 850 - 3,900 cells/uL   Absolute Monocytes 531 200 - 950 cells/uL   Eosinophils Absolute 207 15 - 500 cells/uL   Basophils Absolute 31 0 - 200 cells/uL   Neutrophils Relative % 61.5 %   Total Lymphocyte 25.9 %   Monocytes Relative  8.7 %   Eosinophils Relative 3.4 %   Basophils Relative 0.5 %  Hemoglobin A1c   Collection Time: 03/14/24  8:06 AM  Result Value Ref Range   Hgb A1c MFr Bld 5.3 <5.7 %   Mean Plasma Glucose 105 mg/dL   eAG (mmol/L) 5.8 mmol/L  Lipid panel   Collection Time: 03/14/24  8:06 AM  Result Value Ref Range   Cholesterol 203 (H) <200 mg/dL   HDL 71 > OR =  40 mg/dL   Triglycerides 66 <849 mg/dL   LDL Cholesterol (Calc) 116 (H) mg/dL (calc)   Total CHOL/HDL Ratio 2.9 <5.0 (calc)   Non-HDL Cholesterol (Calc) 132 (H) <130 mg/dL (calc)      Assessment & Plan:   Problem List Items Addressed This Visit     ADHD   Anxiety   Relevant Medications   escitalopram  (LEXAPRO ) 20 MG tablet   Benign prostatic hyperplasia with nocturia   Relevant Medications   tamsulosin  (FLOMAX ) 0.4 MG CAPS capsule   Other Visit Diagnoses       Annual physical exam    -  Primary        Updated Health Maintenance information Reviewed recent lab results with patient Encouraged improvement to lifestyle with diet and exercise Goal of weight loss  Elevated creatinine and decreased GFR suggest reduced hydration and inc creatine supplement No history to suggest CKD. Hydration advised. - Schedule repeat creatinine test in 4-6 weeks. - Advise to stay hydrated.  Hyperlipidemia LDL cholesterol improved to 116. Cardiovascular risk score 6.8%, below statin threshold. Lifestyle changes effective. - Continue lifestyle modifications to further reduce LDL cholesterol. - Prior CT Coronary Calcium 2022, no repeat at this time, in future reconsider  Spermatocele Spermatocele with pain, possibly related to lower back pain. Opted against surgery. - Consult with Dr. Francisca CHERRY [facility] as scheduled for further evaluation of spermatocele and associated pain.  General Health Maintenance Up to date with Shingrix. COVID booster optional. Colonoscopy delayed due to provider shortages. Prevnar 20 recommended pending  insurance. - Check insurance coverage for Prevnar 20 vaccine. - He will call [facility] to inquire about colonoscopy scheduling. Otherwise - Consider referral to [facility] in [place] if scheduling issues persist.  Follow-up Proactive in health management and communication with providers. - Schedule follow-up lab work for July 25th at 8 AM. - Follow up with GI referral status and communicate with provider if issues persist.        No orders of the defined types were placed in this encounter.   Meds ordered this encounter  Medications   tamsulosin  (FLOMAX ) 0.4 MG CAPS capsule    Sig: Take 1 capsule (0.4 mg total) by mouth daily after supper.    Dispense:  90 capsule    Refill:  3   escitalopram  (LEXAPRO ) 20 MG tablet    Sig: Take 1 tablet (20 mg total) by mouth daily.    Dispense:  90 tablet    Refill:  3     Follow up plan: Return in about 1 year (around 03/20/2025) for 1 year fasting lab > 1 week later Annual Physical.  Future lab 04/14/24 BMET + TSH try to get covered  Marsa Officer, DO [facility] 03/20/2024, 3:42 PM

## 2024-03-24 ENCOUNTER — Encounter: Payer: Self-pay | Admitting: Family Medicine

## 2024-03-31 ENCOUNTER — Encounter: Payer: Self-pay | Admitting: Gastroenterology

## 2024-04-06 ENCOUNTER — Ambulatory Visit: Admitting: Urology

## 2024-04-06 ENCOUNTER — Encounter

## 2024-04-06 VITALS — BP 150/72 | HR 79 | Ht 74.0 in | Wt 194.0 lb

## 2024-04-06 DIAGNOSIS — R399 Unspecified symptoms and signs involving the genitourinary system: Secondary | ICD-10-CM | POA: Diagnosis not present

## 2024-04-06 DIAGNOSIS — Z125 Encounter for screening for malignant neoplasm of prostate: Secondary | ICD-10-CM

## 2024-04-06 DIAGNOSIS — N503 Cyst of epididymis: Secondary | ICD-10-CM

## 2024-04-06 NOTE — Patient Instructions (Signed)
 For chronic low back pain, the McGill big 3 can be very helpful, if you google this you can find the exercises with video demonstrations.  Some patients also have had lots of improvement with  foundation training for back pain.  There is a good YouTube video with 10 to 15-minute workout that can really help with low back issues.  If you have worsening scrotal pain or difficulty urinating, please reach out for further evaluation

## 2024-04-06 NOTE — Progress Notes (Signed)
   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 Penne in May 2025 for a 3 cm right spermatocele.  This was previously documented on an ultrasound in 2023.  It has been minimally bothersome.  He does have some occasional discomfort in the right groin but this seems to always be associated with his low back pain or exercise.  He is not interested in surgical removal.  We again reviewed options, and I agree with observation.  I personally viewed and interpreted the scrotal ultrasound from March 2023 showing a 2.6 cm epididymal cyst/spermatocele, no testicular masses  He also has some urinary symptoms with nocturia 2-3 times at night and some urgency during the day.  Currently on Flomax .  We reviewed behavioral strategies, could consider doubling the Flomax  dose.  PSA normal at 1.4, stable from prior.  Can continue screening every other year with PCP.  Follow-up as needed  Ethan JAYSON Burnet, MD  [facility] [address], [address] [place], [place] [address] [phone]

## 2024-04-13 ENCOUNTER — Encounter (INDEPENDENT_AMBULATORY_CARE_PROVIDER_SITE_OTHER): Payer: Self-pay | Admitting: Family Medicine

## 2024-04-13 DIAGNOSIS — F419 Anxiety disorder, unspecified: Secondary | ICD-10-CM | POA: Diagnosis not present

## 2024-04-13 MED ORDER — PROPRANOLOL HCL 20 MG PO TABS: 10.0000 mg | ORAL_TABLET | Freq: Every day | ORAL | 2 refills | Status: DC | PRN

## 2024-04-13 NOTE — Telephone Encounter (Signed)
Please see the MyChart message reply(ies) for my assessment and plan.    This patient gave consent for this Medical Advice Message and is aware that it may result in a bill to their insurance company, as well as the possibility of receiving a bill for a co-payment or deductible. They are an established patient, but are not seeking medical advice exclusively about a problem treated during an in person or video visit in the last seven days. I did not recommend an in person or video visit within seven days of my reply.    I spent a total of 5 minutes cumulative time within 7 days through MyChart messaging.  Axelle Szwed, DO   

## 2024-04-14 ENCOUNTER — Other Ambulatory Visit

## 2024-04-14 DIAGNOSIS — E7800 Pure hypercholesterolemia, unspecified: Secondary | ICD-10-CM

## 2024-04-14 DIAGNOSIS — R7989 Other specified abnormal findings of blood chemistry: Secondary | ICD-10-CM

## 2024-04-14 LAB — BASIC METABOLIC PANEL WITHOUT GFR
BUN: 14 mg/dL (ref 7–25)
CO2: 26 mmol/L (ref 20–32)
Calcium: 9.9 mg/dL (ref 8.6–10.3)
Chloride: 102 mmol/L (ref 98–110)
Creat: 1.01 mg/dL (ref 0.70–1.35)
Glucose, Bld: 87 mg/dL (ref 65–99)
Potassium: 4.4 mmol/L (ref 3.5–5.3)
Sodium: 136 mmol/L (ref 135–146)

## 2024-04-14 LAB — TSH: TSH: 1.89 m[IU]/L (ref 0.40–4.50)

## 2024-04-20 ENCOUNTER — Ambulatory Visit: Payer: Self-pay | Admitting: Family Medicine

## 2024-04-21 ENCOUNTER — Encounter: Admitting: Gastroenterology

## 2024-05-29 ENCOUNTER — Encounter: Payer: Self-pay | Admitting: Family Medicine

## 2024-06-12 ENCOUNTER — Telehealth (INDEPENDENT_AMBULATORY_CARE_PROVIDER_SITE_OTHER): Admitting: Family Medicine

## 2024-06-12 ENCOUNTER — Encounter: Payer: Self-pay | Admitting: Family Medicine

## 2024-06-12 DIAGNOSIS — F419 Anxiety disorder, unspecified: Secondary | ICD-10-CM | POA: Diagnosis not present

## 2024-06-12 DIAGNOSIS — R000 Tachycardia, unspecified: Secondary | ICD-10-CM

## 2024-06-12 MED ORDER — PROPRANOLOL HCL ER 60 MG PO CP24: 60.0000 mg | ORAL_CAPSULE | Freq: Every day | ORAL | 0 refills | Status: DC

## 2024-06-12 NOTE — Progress Notes (Signed)
 Subjective:    Patient ID: Ethan Glenn, male    DOB: Sep 22, 1960, 63 y.o.   MRN: [id number]  Ethan Glenn is a 63 y.o. male presenting on 06/12/2024 for Anxiety   Virtual / Telehealth Encounter - Video Visit via MyChart The purpose of this virtual visit is to provide medical care while limiting exposure to the novel coronavirus (COVID19) for both patient and office staff.  Consent was obtained for remote visit:  Yes.   Answered questions that patient had about telehealth interaction:  Yes.   I discussed the limitations, risks, security and privacy concerns of performing an evaluation and management service by video/telephone. I also discussed with the patient that there may be a patient responsible charge related to this service. The patient expressed understanding and agreed to proceed.  Patient Location: Home Provider Location: [facility] (Office)  Participants in virtual visit: - Patient: Ethan Glenn - CMA: Alan Fontana CMA - Provider: Dr Edman   HPI  Discussed the use of AI scribe software for clinical note transcription with the patient, who gave verbal consent to proceed.  History of Present Illness   Ethan Glenn is a 63 year old male with anxiety and migraines who presents with increased anxiety symptoms despite current medication regimen.  Anxiety ADHD  He has returned to work teaching, [employer] with commute up to 1 hr, he has been there since July. He is doing well and happy with this school / location and job. - Increased anxiety symptoms, including tinnitus, tachycardia, and nocturnal awakenings - Symptoms have become more frequent, though not daily, and are similar to pre-Lexapro  baseline - Stress response is perceived as disproportionate to circumstances - Nocturnal awakenings with a racing mind - Elevated heart rate during commute - Current regimen includes Lexapro  20 mg daily, initially effective but now  insufficient - Propranolol  IR 10mg  used as needed, particularly in the mornings when anxiety is heightened; 20 mg dose provides short-term relief throughout the day - Previous trial of Buspar  was ineffective - Continues to see a therapist every couple of weeks, which is beneficial - 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  Migraine history - No migraines for over a year - No use of sumatriptan  in over a year; prescriptions have expired without use  Exercise intolerance and physical activity - Demanding work schedule and long commute limit ability to exercise regularly - Attempts to compensate by using home weights and staying in a hotel several times a week for more rest and exercise - Lack of regular gym workouts perceived as contributing to stress - Lightheadedness and visual disturbances (seeing spots) during physical activities such as playing pickleball or doing squats - Concerned about potential injuries from these activities and considering alternative forms of exercise           03/20/2024    3:26 PM 01/25/2024   10:14 AM 07/06/2023    6:28 PM  Depression screen PHQ 2/9  Decreased Interest 0 0 1  Down, Depressed, Hopeless 1 0 0  PHQ - 2 Score 1 0 1  Altered sleeping 1  0  Tired, decreased energy 0  2  Change in appetite 0  0  Feeling bad or failure about yourself  0  0  Trouble concentrating 1  1  Moving slowly or fidgety/restless 0  0  Suicidal thoughts 0  0  PHQ-9 Score 3  4  Difficult doing work/chores Not difficult at all  Not difficult at all       03/20/2024    3:26 PM 01/25/2024   10:14 AM 07/06/2023    6:28 PM 03/18/2023    9:47 AM  GAD 7 : Generalized Anxiety Score  Nervous, Anxious, on Edge 0 0 1 0  Control/stop worrying 0 0 0 0  Worry too much - different things 0 0 0 0  Trouble relaxing 0 0 1 0  Restless 0 0 0 0  Easily annoyed or irritable 0 0 1 0  Afraid - awful might happen 0 0 0 0  Total GAD 7 Score 0 0 3 0  Anxiety  Difficulty Not difficult at all  Not difficult at all Not difficult at all    Social History   Tobacco Use   Smoking status: Former    Current packs/day: 0.00    Types: Cigarettes    Quit date: 08/07/1986    Years since quitting: 37.8   Smokeless tobacco: Never  Vaping Use   Vaping status: Never Used  Substance Use Topics   Alcohol use: Yes    Alcohol/week: 3.0 standard drinks of alcohol    Types: 3 Standard drinks or equivalent per week   Drug use: Never    Review of Systems Per HPI unless specifically indicated above     Objective:    There were no vitals taken for this visit.  Wt Readings from Last 3 Encounters:  04/06/24 194 lb (88 kg)  03/20/24 201 lb 3.2 oz (91.3 kg)  02/01/24 199 lb 4 oz (90.4 kg)     Physical Exam  Note examination was completely remotely via video observation objective data only  Gen - well-appearing, no acute distress or apparent pain, comfortable HEENT - eyes appear clear without discharge or redness Heart/Lungs - cannot examine virtually - observed no evidence of coughing or labored breathing. Abd - cannot examine virtually  Skin - face visible today- no rash Neuro - awake, alert, oriented Psych - not anxious appearing   Results for orders placed or performed in visit on 04/14/24  TSH   Collection Time: 04/14/24  8:04 AM  Result Value Ref Range   TSH 1.89 0.40 - 4.50 mIU/L  Basic Metabolic Panel Without GFR   Collection Time: 04/14/24  8:04 AM  Result Value Ref Range   Glucose, Bld 87 65 - 99 mg/dL   BUN 14 7 - 25 mg/dL   Creat 8.98 9.29 - 8.64 mg/dL   BUN/Creatinine Ratio SEE NOTE: 6 - 22 (calc)   Sodium 136 135 - 146 mmol/L   Potassium 4.4 3.5 - 5.3 mmol/L   Chloride 102 98 - 110 mmol/L   CO2 26 20 - 32 mmol/L   Calcium 9.9 8.6 - 10.3 mg/dL      Assessment & Plan:   Problem List Items Addressed This Visit     Anxiety - Primary   Relevant Medications   propranolol  ER (INDERAL  LA) 60 MG 24 hr capsule   Other Visit  Diagnoses       Tachycardia       Relevant Medications   propranolol  ER (INDERAL  LA) 60 MG 24 hr capsule        Generalized anxiety disorder ADHD Anxiety provoking symptoms 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 Failed Buspar . Improved on Propranolol  20mg  daily AS NEEDED for immediate release for anxiety in AM. Past treatments for ADHD ineffective or with  side effects see prior records, he prefers to avoid ADHD therapy at this time even though it seems to be a factor for his symptoms  Discussion on options. Suggested longer acting beta blocker for sustained management of anxiety, heart rate. We can keep adjusting dose for IR vs Long Acting if he prefers based on response.  Lexapro  effective long-term, and he prefers not to change it. Exercise limitations may contribute to anxiety. - Prescribe propranolol  extended-release 60 mg daily for sustained anxiety control. - Discontinue immediate-release propranolol  but keep it in reserve for acute needs. - Monitor response to propranolol  extended-release and adjust as needed.  Migraine, without current attacks No current migraine attacks. Lexapro  and propranolol  may provide prophylactic benefit. No sumatriptan  use for over a year, indicating effective prevention.  Orthostatic dizziness with exertion Likely due to rapid head movements and positional changes during activities. Symptoms include lightheadedness and visual disturbances. Likely related to age and physical exertion. - Advise on potential for orthostatic dizziness with rapid movements and suggest caution during activities. - Consider alternative activities with less rapid movement to prevent dizziness and potential injuries.        No orders of the defined types were placed in this encounter.   Meds ordered this encounter  Medications   propranolol  ER (INDERAL  LA) 60 MG 24 hr capsule    Sig:  Take 1 capsule (60 mg total) by mouth daily.    Dispense:  90 capsule    Refill:  0    Follow up plan: Return if symptoms worsen or fail to improve.   Patient verbalizes understanding with the above medical recommendations including the limitation of remote medical advice.  Specific follow-up and call-back criteria were given for patient to follow-up or seek medical care more urgently if needed.  Total duration of direct patient care provided via video conference: 15 minutes   Marsa Officer, DO [facility] 06/12/2024, 4:12 PM

## 2024-06-12 NOTE — Patient Instructions (Addendum)
 Thank you for coming to the office today.  New rx Propranolol  Long Acting LA 60mg  daily  Please schedule a Follow-up Appointment to: Return if symptoms worsen or fail to improve.  If you have any other questions or concerns, please feel free to call the office or send a message through MyChart. You may also schedule an earlier appointment if necessary.  Additionally, you may be receiving a survey about your experience at our office within a few days to 1 week by e-mail or mail. We value your feedback.  Marsa Officer, DO [facility], [place]

## 2024-06-26 ENCOUNTER — Ambulatory Visit
Admission: RE | Admit: 2024-06-26 | Discharge: 2024-06-26 | Disposition: A | Discharge status: Home / Self Care | Source: Ambulatory Visit | Attending: Emergency Medicine | Admitting: Emergency Medicine

## 2024-06-26 VITALS — BP 108/74 | HR 73 | Temp 98.6°F | Resp 20

## 2024-06-26 DIAGNOSIS — J069 Acute upper respiratory infection, unspecified: Secondary | ICD-10-CM

## 2024-06-26 MED ORDER — BENZONATATE 100 MG PO CAPS
100.0000 mg | ORAL_CAPSULE | Freq: Three times a day (TID) | ORAL | 0 refills | Status: DC
Start: 2024-06-26 — End: 2024-07-10

## 2024-06-26 MED ORDER — AMOXICILLIN-POT CLAVULANATE 875-125 MG PO TABS: 1.0000 | ORAL_TABLET | Freq: Two times a day (BID) | ORAL | 0 refills | Status: DC

## 2024-06-26 NOTE — Discharge Instructions (Signed)
 Begin Augmentin  twice daily for 7 days to provide coverage for bacteria which may be causing symptoms to linger  You may use Tessalon pill every 8 hours as needed for cough in addition to over-the-counter medications    You can take Tylenol and/or Ibuprofen as needed for fever reduction and pain relief.   For cough: honey 1/2 to 1 teaspoon (you can dilute the honey in water or another fluid).  You can also use guaifenesin and dextromethorphan for cough. You can use a humidifier for chest congestion and cough.  If you don't have a humidifier, you can sit in the bathroom with the hot shower running.      For sore throat: try warm salt water gargles, cepacol lozenges, throat spray, warm tea or water with lemon/honey, popsicles or ice, or OTC cold relief medicine for throat discomfort.   For congestion: take a daily anti-histamine like Zyrtec , Claritin, and a oral decongestant, such as pseudoephedrine .  You can also use Flonase 1-2 sprays in each nostril daily.   It is important to stay hydrated: drink plenty of fluids (water, gatorade/powerade/pedialyte, juices, or teas) to keep your throat moisturized and help further relieve irritation/discomfort.

## 2024-06-26 NOTE — ED Triage Notes (Signed)
 Patient reports productive cough with green mucus and sore throat and body aches that started 06/19/24. Patient took two  Covid and one flu test results negative. Rates pain 2/10.  Patient took mucinex, Ibuprofen and Tylenol with mild relief.

## 2024-06-26 NOTE — ED Provider Notes (Signed)
 Ethan Glenn    CSN: [id number] Arrival date & time: 06/26/24  9095      History   Chief Complaint Chief Complaint  Patient presents with   Cough    Have been sick since Monday, September 29. Started out as a cough and sore throat. Progressed to add congestion and body aches. Cough is worsening, unproductive. Two Covid tests and one flu test all negative. - Entered by patient   Generalized Body Aches   Sore Throat    HPI Ethan Glenn is a 63 y.o. male.   Patient presents for evaluation of fatigue, malaise, bilateral ear fullness described as mild, sore throat, productive cough with green mucus, chest congestion and generalized bodyaches present for 7 days.  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  Tolerable to food and liquids.  Has attempted use of Mucinex and Tylenol.  Home COVID testing x 2 and flu test x 1 negative.  Denies fever, shortness of breath or wheezing.  Denies respiratory history, former smoker.  Past Medical History:  Diagnosis Date   ADHD    Allergic rhinitis    Anxiety    Frequent headaches    Migraine     Patient Active Problem List   Diagnosis Date Noted   Chronic neck pain 03/11/2022   Osteoarthritis of multiple joints 03/11/2022   Muscle spasm of back 03/11/2022   Thoracic ascending aortic aneurysm 09/03/2021   Pure hypercholesterolemia 08/28/2021   Family history of cardiovascular disease 08/28/2021   Benign prostatic hyperplasia with nocturia 08/28/2021   Primary osteoarthritis of both hands 01/17/2021   Trigger finger of all digits of both hands 01/17/2021   Acquired trigger finger of right little finger 01/17/2021   Chronic thumb pain, bilateral 01/17/2021   Anxiety 08/07/2020   ADHD 08/07/2020   Headache 08/07/2020   Immunity status testing 08/07/2020    Past Surgical History:  Procedure Laterality Date   HERNIA REPAIR  1965   umbilical   KNEE SURGERY Left 1984   knee arthroscopic meniscectomy       Home  Medications    Prior to Admission medications   Medication Sig Start Date End Date Taking? Authorizing Provider  amoxicillin -clavulanate (AUGMENTIN ) 875-125 MG tablet Take 1 tablet by mouth every 12 (twelve) hours. 06/26/24  Yes Melyssa Signor R, NP  benzonatate (TESSALON) 100 MG capsule Take 1 capsule (100 mg total) by mouth every 8 (eight) hours. 06/26/24  Yes Jobin Montelongo R, NP  escitalopram  (LEXAPRO ) 20 MG tablet Take 1 tablet (20 mg total) by mouth daily. 03/20/24   Karamalegos, Marsa PARAS, DO  loratadine (CLARITIN) 10 MG tablet Take 10 mg by mouth daily.    [provider]  Multiple Vitamins-Minerals (PRESERVISION AREDS PO) Take by mouth daily.    [provider]  propranolol  (INDERAL ) 20 MG tablet Take 0.5-1 tablets (10-20 mg total) by mouth daily as needed (situational anxiety). 04/13/24   Karamalegos, Marsa PARAS, DO  propranolol  ER (INDERAL  LA) 60 MG 24 hr capsule Take 1 capsule (60 mg total) by mouth daily. 06/12/24   Karamalegos, Marsa PARAS, DO  SUMAtriptan  (IMITREX ) 20 MG/ACT nasal spray Place 1 spray (20 mg total) into the nose daily as needed for migraine or headache. May repeat in 2 hours if headache persists or recurs. 02/23/24   Karamalegos, Marsa PARAS, DO  tamsulosin  (FLOMAX ) 0.4 MG CAPS capsule Take 1 capsule (0.4 mg total) by mouth daily after supper. 03/20/24   Edman Marsa PARAS, DO  Family History Family History  Problem Relation Age of Onset   Glaucoma Mother    Ovarian cancer Mother    Hypertension Father    Stroke Father    Anxiety disorder Sister     Social History Social History   Tobacco Use   Smoking status: Former    Current packs/day: 0.00    Types: Cigarettes    Quit date: 08/07/1986    Years since quitting: 37.9   Smokeless tobacco: Never  Vaping Use   Vaping status: Never Used  Substance Use Topics   Alcohol use: Yes    Alcohol/week: 3.0 standard drinks of alcohol    Types: 3 Standard drinks or equivalent per week    Drug use: Never     Allergies   Erythromycin and Quinolones   Review of Systems Review of Systems   Physical Exam Triage Vital Signs ED Triage Vitals  Encounter Vitals Group     BP 06/26/24 0925 108/74     Girls Systolic BP Percentile --      Girls Diastolic BP Percentile --      Boys Systolic BP Percentile --      Boys Diastolic BP Percentile --      Pulse Rate 06/26/24 0925 73     Resp 06/26/24 0925 20     Temp 06/26/24 0925 98.6 F (37 C)     Temp Source 06/26/24 0925 Oral     SpO2 06/26/24 0925 99 %     Weight --      Height --      Head Circumference --      Peak Flow --      Pain Score 06/26/24 0930 2     Pain Loc --      Pain Education --      Exclude from Growth Chart --    No data found.  Updated Vital Signs BP 108/74 (BP Location: Left Arm)   Pulse 73   Temp 98.6 F (37 C) (Oral)   Resp 20   SpO2 99%   Visual Acuity Right Eye Distance:   Left Eye Distance:   Bilateral Distance:    Right Eye Near:   Left Eye Near:    Bilateral Near:     Physical Exam Constitutional:      Appearance: Normal appearance.  HENT:     Head: Normocephalic.     Right Ear: Tympanic membrane, ear canal and external ear normal.     Left Ear: Tympanic membrane, ear canal and external ear normal.     Nose: Congestion present.     Mouth/Throat:     Pharynx: No oropharyngeal exudate or posterior oropharyngeal erythema.  Eyes:     Extraocular Movements: Extraocular movements intact.  Cardiovascular:     Rate and Rhythm: Normal rate and regular rhythm.     Pulses: Normal pulses.     Heart sounds: Normal heart sounds.  Pulmonary:     Effort: Pulmonary effort is normal.     Breath sounds: Normal breath sounds.     Comments: Congested cough witnessed Lymphadenopathy:     Cervical: Cervical adenopathy present.  Neurological:     Mental Status: He is alert and oriented to person, place, and time. Mental status is at baseline.      UC Treatments / Results   Labs (all labs ordered are listed, but only abnormal results are displayed) Labs Reviewed - No data to display  EKG   Radiology No results found.  Procedures  Procedures (including critical care time)  Medications Ordered in UC Medications - No data to display  Initial Impression / Assessment and Plan / UC Course  I have reviewed the triage vital signs and the nursing notes.  Pertinent labs & imaging results that were available during my care of the patient were reviewed by me and considered in my medical decision making (see chart for details).  Acute URI  Patient is in no signs of distress nor toxic appearing.  Vital signs are stable.  Low suspicion for pneumonia, pneumothorax or bronchitis and therefore will defer imaging.  Viral testing deferred due to timeline of illness, has also completed home testing.  Symptoms persisting for 7 days without signs of resolution and patient endorses feels that they are worsening, empirically placed on Augmentin  as well as prescribed Tessalon for management of cough. May use additional over-the-counter medications as needed for supportive care.  May follow-up with urgent care as needed if symptoms persist or worsen.  Note given.   Final Clinical Impressions(s) / UC Diagnoses   Final diagnoses:  Acute URI     Discharge Instructions      Begin Augmentin  twice daily for 7 days to provide coverage for bacteria which may be causing symptoms to linger  You may use Tessalon pill every 8 hours as needed for cough in addition to over-the-counter medications    You can take Tylenol and/or Ibuprofen as needed for fever reduction and pain relief.   For cough: honey 1/2 to 1 teaspoon (you can dilute the honey in water or another fluid).  You can also use guaifenesin and dextromethorphan for cough. You can use a humidifier for chest congestion and cough.  If you don't have a humidifier, you can sit in the bathroom with the hot shower running.       For sore throat: try warm salt water gargles, cepacol lozenges, throat spray, warm tea or water with lemon/honey, popsicles or ice, or OTC cold relief medicine for throat discomfort.   For congestion: take a daily anti-histamine like Zyrtec , Claritin, and a oral decongestant, such as pseudoephedrine .  You can also use Flonase 1-2 sprays in each nostril daily.   It is important to stay hydrated: drink plenty of fluids (water, gatorade/powerade/pedialyte, juices, or teas) to keep your throat moisturized and help further relieve irritation/discomfort.    ED Prescriptions     Medication Sig Dispense Auth. Provider   amoxicillin -clavulanate (AUGMENTIN ) 875-125 MG tablet Take 1 tablet by mouth every 12 (twelve) hours. 14 tablet Egon Dittus R, NP   benzonatate (TESSALON) 100 MG capsule Take 1 capsule (100 mg total) by mouth every 8 (eight) hours. 21 capsule Finnlee Guarnieri, Shelba SAUNDERS, NP      PDMP not reviewed this encounter.   Teresa Shelba SAUNDERS, [place] 06/26/24 [phone]

## 2024-07-04 ENCOUNTER — Ambulatory Visit (INDEPENDENT_AMBULATORY_CARE_PROVIDER_SITE_OTHER)

## 2024-07-04 ENCOUNTER — Ambulatory Visit
Admission: RE | Admit: 2024-07-04 | Discharge: 2024-07-04 | Disposition: A | Discharge status: Home / Self Care | Attending: Emergency Medicine | Admitting: Emergency Medicine

## 2024-07-04 VITALS — BP 114/65 | HR 69 | Temp 98.0°F | Resp 18

## 2024-07-04 DIAGNOSIS — R051 Acute cough: Secondary | ICD-10-CM | POA: Diagnosis not present

## 2024-07-04 DIAGNOSIS — R0989 Other specified symptoms and signs involving the circulatory and respiratory systems: Secondary | ICD-10-CM | POA: Diagnosis not present

## 2024-07-04 DIAGNOSIS — R059 Cough, unspecified: Secondary | ICD-10-CM | POA: Diagnosis not present

## 2024-07-04 DIAGNOSIS — R918 Other nonspecific abnormal finding of lung field: Secondary | ICD-10-CM | POA: Diagnosis not present

## 2024-07-04 MED ORDER — PREDNISONE 10 MG (21) PO TBPK: ORAL_TABLET | Freq: Every day | ORAL | 0 refills | Status: DC

## 2024-07-04 NOTE — ED Triage Notes (Signed)
 Patient to Urgent Care with complaints of cough/ chest congestion.  Symptoms x3 weeks. Finished Augmentin  with some improvement. Now having some symptoms that he was experiencing at the beginning of illness (eyes itching/ sensitive skin).

## 2024-07-04 NOTE — Discharge Instructions (Addendum)
 Take the prednisone as directed.  Follow-up with your primary care provider.  Go to the emergency department if you have worsening symptoms.

## 2024-07-04 NOTE — ED Provider Notes (Signed)
 Ethan Glenn    CSN: [id number] Arrival date & time: 07/04/24  1652      History   Chief Complaint Chief Complaint  Patient presents with   Cough    Just finished a round of antibiotics. Symptoms did improve somewhat overall. Cough never cleared. Other symptoms are returning: itchy eyes, scratchy throat, sensitive skin. - Entered by patient    HPI Ethan Glenn is a 63 y.o. male.  Patient presents with lingering cough x 3 weeks.  He recently completed an antibiotic for a URI and states his other symptoms improved.  He no longer has congestion.  No fever, shortness of breath, chest pain.  Today he has had some scratchy throat and eye watering / itchiness.  No OTC medications taken today.  Patient was seen at this urgent care on 06/26/2024; diagnosed with acute URI; treated with Augmentin  and Tessalon Perles.  The history is provided by the patient and medical records.    Past Medical History:  Diagnosis Date   ADHD    Allergic rhinitis    Anxiety    Frequent headaches    Migraine     Patient Active Problem List   Diagnosis Date Noted   Chronic neck pain 03/11/2022   Osteoarthritis of multiple joints 03/11/2022   Muscle spasm of back 03/11/2022   Thoracic ascending aortic aneurysm 09/03/2021   Pure hypercholesterolemia 08/28/2021   Family history of cardiovascular disease 08/28/2021   Benign prostatic hyperplasia with nocturia 08/28/2021   Primary osteoarthritis of both hands 01/17/2021   Trigger finger of all digits of both hands 01/17/2021   Acquired trigger finger of right little finger 01/17/2021   Chronic thumb pain, bilateral 01/17/2021   Anxiety 08/07/2020   ADHD 08/07/2020   Headache 08/07/2020   Immunity status testing 08/07/2020    Past Surgical History:  Procedure Laterality Date   HERNIA REPAIR  1965   umbilical   KNEE SURGERY Left 1984   knee arthroscopic meniscectomy       Home Medications    Prior to Admission medications    Medication Sig Start Date End Date Taking? Authorizing Provider  predniSONE (STERAPRED UNI-PAK 21 TAB) 10 MG (21) TBPK tablet Take by mouth daily. As directed 07/04/24  Yes Corlis Burnard DEL, NP  amoxicillin -clavulanate (AUGMENTIN ) 875-125 MG tablet Take 1 tablet by mouth every 12 (twelve) hours. Patient not taking: Reported on 07/04/2024 06/26/24   Teresa Shelba SAUNDERS, NP  benzonatate (TESSALON) 100 MG capsule Take 1 capsule (100 mg total) by mouth every 8 (eight) hours. Patient not taking: Reported on 07/04/2024 06/26/24   Teresa Shelba SAUNDERS, NP  escitalopram  (LEXAPRO ) 20 MG tablet Take 1 tablet (20 mg total) by mouth daily. 03/20/24   Karamalegos, Marsa PARAS, DO  loratadine (CLARITIN) 10 MG tablet Take 10 mg by mouth daily.    [provider]  Multiple Vitamins-Minerals (PRESERVISION AREDS PO) Take by mouth daily.    [provider]  propranolol  (INDERAL ) 20 MG tablet Take 0.5-1 tablets (10-20 mg total) by mouth daily as needed (situational anxiety). 04/13/24   Karamalegos, Marsa PARAS, DO  propranolol  ER (INDERAL  LA) 60 MG 24 hr capsule Take 1 capsule (60 mg total) by mouth daily. 06/12/24   Karamalegos, Marsa PARAS, DO  SUMAtriptan  (IMITREX ) 20 MG/ACT nasal spray Place 1 spray (20 mg total) into the nose daily as needed for migraine or headache. May repeat in 2 hours if headache persists or recurs. 02/23/24   Karamalegos, Marsa PARAS, DO  tamsulosin  (FLOMAX )  0.4 MG CAPS capsule Take 1 capsule (0.4 mg total) by mouth daily after supper. 03/20/24   Edman Marsa PARAS, DO    Family History Family History  Problem Relation Age of Onset   Glaucoma Mother    Ovarian cancer Mother    Hypertension Father    Stroke Father    Anxiety disorder Sister     Social History Social History   Tobacco Use   Smoking status: Former    Current packs/day: 0.00    Types: Cigarettes    Quit date: 08/07/1986    Years since quitting: 37.9   Smokeless tobacco: Never  Vaping Use   Vaping  status: Never Used  Substance Use Topics   Alcohol use: Not Currently    Alcohol/week: 3.0 standard drinks of alcohol    Types: 3 Standard drinks or equivalent per week   Drug use: Never     Allergies   Erythromycin and Quinolones   Review of Systems Review of Systems  Constitutional:  Negative for chills and fever.  HENT:  Negative for congestion, ear pain and sore throat.   Eyes:  Positive for itching. Negative for pain, discharge, redness and visual disturbance.  Respiratory:  Positive for cough. Negative for shortness of breath.   Cardiovascular:  Negative for chest pain and palpitations.     Physical Exam Triage Vital Signs ED Triage Vitals [07/04/24 1707]  Encounter Vitals Group     BP 114/65     Girls Systolic BP Percentile      Girls Diastolic BP Percentile      Boys Systolic BP Percentile      Boys Diastolic BP Percentile      Pulse Rate 69     Resp 18     Temp 98 F (36.7 C)     Temp src      SpO2 98 %     Weight      Height      Head Circumference      Peak Flow      Pain Score      Pain Loc      Pain Education      Exclude from Growth Chart    No data found.  Updated Vital Signs BP 114/65   Pulse 69   Temp 98 F (36.7 C)   Resp 18   SpO2 98%   Visual Acuity Right Eye Distance:   Left Eye Distance:   Bilateral Distance:    Right Eye Near:   Left Eye Near:    Bilateral Near:     Physical Exam Constitutional:      General: He is not in acute distress. HENT:     Right Ear: Tympanic membrane normal.     Left Ear: Tympanic membrane normal.     Nose: Nose normal.     Mouth/Throat:     Mouth: Mucous membranes are moist.     Pharynx: Oropharynx is clear.     Comments: Clear PND Cardiovascular:     Rate and Rhythm: Normal rate and regular rhythm.     Heart sounds: Normal heart sounds.  Pulmonary:     Effort: Pulmonary effort is normal. No respiratory distress.     Breath sounds: Normal breath sounds.  Neurological:     Mental  Status: He is alert.      UC Treatments / Results  Labs (all labs ordered are listed, but only abnormal results are displayed) Labs Reviewed - No data to display  EKG   Radiology DG Chest 2 View Result Date: 07/04/2024 CLINICAL DATA:  Cough and chest congestion x3 weeks. EXAM: CHEST - 2 VIEW COMPARISON:  None Available. FINDINGS: The heart size and mediastinal contours are within normal limits. Very mild linear scarring and/or atelectasis is seen along the periphery of the right upper lobe. No acute infiltrate, pleural effusion or pneumothorax is identified. The visualized skeletal structures are unremarkable. IMPRESSION: Very mild right upper lobe linear scarring and/or atelectasis. Electronically Signed   By: Suzen Dials M.D.   On: 07/04/2024 17:35    Procedures Procedures (including critical care time)  Medications Ordered in UC Medications - No data to display  Initial Impression / Assessment and Plan / UC Course  I have reviewed the triage vital signs and the nursing notes.  Pertinent labs & imaging results that were available during my care of the patient were reviewed by me and considered in my medical decision making (see chart for details).   Cough.  Afebrile and vital signs are stable.  Lungs are clear and O2 sat is 98% on room air.  No respiratory distress.  CXR shows Very mild right upper lobe linear scarring and/or atelectasis.  Treating today with prednisone taper.  Instructed patient to follow-up with his PCP.  ED precautions given.  Education provided on adult cough.  He agrees to plan of care.   Final Clinical Impressions(s) / UC Diagnoses   Final diagnoses:  Acute cough     Discharge Instructions      Take the prednisone as directed.  Follow-up with your primary care provider.  Go to the emergency department if you have worsening symptoms.     ED Prescriptions     Medication Sig Dispense Auth. Provider   predniSONE (STERAPRED UNI-PAK 21 TAB)  10 MG (21) TBPK tablet Take by mouth daily. As directed 21 tablet Corlis Burnard DEL, NP      PDMP not reviewed this encounter.   Corlis Burnard DEL, NP 07/04/24 1755

## 2024-07-10 ENCOUNTER — Ambulatory Visit

## 2024-07-10 VITALS — Ht 74.0 in | Wt 195.0 lb

## 2024-07-10 DIAGNOSIS — Z860100 Personal history of colon polyps, unspecified: Secondary | ICD-10-CM

## 2024-07-10 MED ORDER — NA SULFATE-K SULFATE-MG SULF 17.5-3.13-1.6 GM/177ML PO SOLN: 1.0000 | Freq: Once | ORAL | 0 refills | Status: AC

## 2024-07-10 NOTE — Progress Notes (Signed)
 No egg or soy allergy known to patient  No issues known to pt with past sedation with any surgeries or procedures Patient denies ever being told they had issues or difficulty with intubation  No FH of Malignant Hyperthermia Pt is not on diet pills Pt is not on  home 02  Pt is not on blood thinners  Pt denies issues with constipation  No A fib or A flutter Have any cardiac testing pending-- no  LOA: independent  Prep: suprep xtra miralax  Patient's chart reviewed by Norleen Schillings CNRA prior to previsit and patient appropriate for the [facility].  Previsit completed and red dot placed by patient's name on their procedure day (on provider's schedule).     PV completed with patient. Prep instructions sent via mychart and home address.

## 2024-07-27 ENCOUNTER — Ambulatory Visit (INDEPENDENT_AMBULATORY_CARE_PROVIDER_SITE_OTHER)

## 2024-07-27 DIAGNOSIS — D229 Melanocytic nevi, unspecified: Secondary | ICD-10-CM

## 2024-07-27 DIAGNOSIS — Z1283 Encounter for screening for malignant neoplasm of skin: Secondary | ICD-10-CM

## 2024-07-27 DIAGNOSIS — W908XXA Exposure to other nonionizing radiation, initial encounter: Secondary | ICD-10-CM | POA: Diagnosis not present

## 2024-07-27 DIAGNOSIS — D1801 Hemangioma of skin and subcutaneous tissue: Secondary | ICD-10-CM

## 2024-07-27 DIAGNOSIS — L814 Other melanin hyperpigmentation: Secondary | ICD-10-CM

## 2024-07-27 DIAGNOSIS — L918 Other hypertrophic disorders of the skin: Secondary | ICD-10-CM

## 2024-07-27 DIAGNOSIS — L820 Inflamed seborrheic keratosis: Secondary | ICD-10-CM

## 2024-07-27 DIAGNOSIS — L821 Other seborrheic keratosis: Secondary | ICD-10-CM | POA: Diagnosis not present

## 2024-07-27 DIAGNOSIS — L578 Other skin changes due to chronic exposure to nonionizing radiation: Secondary | ICD-10-CM

## 2024-07-27 NOTE — Progress Notes (Signed)
 Subjective   Ethan Glenn is a 63 y.o. male who presents for the following: irritated spots that patient would like removed. Patient is established patient   Today patient reports: Spots at back, axilla, right side that itch. No hx skin cancer, pt does have hx of AK's.  Review of Systems:    No other skin or systemic complaints except as noted in HPI or Assessment and Plan.  The following portions of the chart were reviewed this encounter and updated as appropriate: medications, allergies, medical history  Relevant Medical History:  n/a   Objective  (SKPE) Well appearing patient in no apparent distress; mood and affect are within normal limits. Examination was performed of the: Waist Up Skin Exam: scalp, head, eyes, ears, nose, lips, neck, chest, axillae, upper extremities, abdomen, back, hands, fingers, fingernails   Examination notable for: Angioma(s): Scattered red vascular papule(s)  , Lentigo/lentigines: Scattered pigmented macules that are tan to brown in color and are somewhat non-uniform in shape and concentrated in the sun-exposed areas, Nevus/nevi: Scattered well-demarcated, regular, pigmented macule(s) and/or papule(s)  , Seborrheic Keratosis(es): Stuck-on appearing keratotic papule(s) on the trunk, some  irritated with redness, crusting, edema, and/or partial avulsion, Actinic Damage/Elastosis: chronic sun damage: dyspigmentation, telangiectasia, and wrinkling  Examination limited by: Undergarments, Shoes or socks , and Clothing   R axilla x 1, R flank x 1, R shoulder x 1, back x 7, L axilla x 2 (12) Erythematous stuck-on, waxy papule or plaque L axilla x 4, R axilla x 4 (8) Fleshy, skin-colored pedunculated papules.    Assessment & Plan  (SKAP)   BENIGN SKIN FINDINGS  - Lentigines  - Seborrheic keratoses  - Hemangiomas   - Nevus/Multiple Benign Nevi - Reassurance provided regarding the benign appearance of lesions noted on exam today; no treatment is indicated in  the absence of symptoms/changes. - Reinforced importance of photoprotective strategies including liberal and frequent sunscreen use of a broad-spectrum SPF 30 or greater, use of protective clothing, and sun avoidance for prevention of cutaneous malignancy and photoaging.  Counseled patient on the importance of regular self-skin monitoring as well as routine clinical skin examinations as scheduled.   ACTINIC DAMAGE - Chronic condition, secondary to cumulative UV/sun exposure - Recommend daily broad spectrum sunscreen SPF 30+ to sun-exposed areas, reapply every 2 hours as needed.  - Staying in the shade or wearing long sleeves, sun glasses (UVA+UVB protection) and wide brim hats (4-inch brim around the entire circumference of the hat) are also recommended for sun protection.  - Call for new or changing lesions.  Cosmetic removal of skin tags - procedure note below - paid out of pocket, do not bill insurance   Chronic and persistent condition with duration or expected duration over one year. Condition is stable and at treatment goal.   Patient instructions (SKPI)   Procedures, orders, diagnosis for this visit:  INFLAMED SEBORRHEIC KERATOSIS (12) R axilla x 1, R flank x 1, R shoulder x 1, back x 7, L axilla x 2 (12) Symptomatic, irritating, patient would like treated.  Benign-appearing.  Call clinic for new or changing lesions.   Destruction of lesion - R axilla x 1, R flank x 1, R shoulder x 1, back x 7, L axilla x 2 (12) Complexity: simple   Destruction method: cryotherapy   Informed consent: discussed and consent obtained   Timeout:  patient name, date of birth, surgical site, and procedure verified Lesion destroyed using liquid nitrogen: Yes   Region frozen  until ice ball extended beyond lesion: Yes   Cryo cycles: 1 or 2. Outcome: patient tolerated procedure well with no complications   Post-procedure details: wound care instructions given    SKIN TAG (8) L axilla x 4, R axilla x  4 (8) Symptomatic, irritating, patient would like treated.  Benign-appearing.  Call clinic for new or changing lesions.   Patient paid out-of-pocket, do not bill insurance. Destruction of lesion - L axilla x 4, R axilla x 4 (8) Complexity: simple   Destruction method: cryotherapy   Informed consent: discussed and consent obtained   Timeout:  patient name, date of birth, surgical site, and procedure verified Lesion destroyed using liquid nitrogen: Yes   Region frozen until ice ball extended beyond lesion: Yes   Outcome: patient tolerated procedure well with no complications    SKIN EXAM FOR MALIGNANT NEOPLASM   LENTIGO   SEBORRHEIC KERATOSIS   CHERRY ANGIOMA   MULTIPLE BENIGN NEVI   ACTINIC SKIN DAMAGE    Inflamed seborrheic keratosis -     Destruction of lesion  Skin tag -     Destruction of lesion  Skin exam for malignant neoplasm  Lentigo  Seborrheic keratosis  Cherry angioma  Multiple benign nevi  Actinic skin damage    Return to clinic: Return for TBSE, with Dr. Raymund.  Called patient to check on them after yesterday's surgery. N/A, LVM to call if any concerns. Lonell RAMAN., RMA   Documentation: I have reviewed the above documentation for accuracy and completeness, and I agree with the above.  Lauraine JAYSON Raymund, MD

## 2024-07-27 NOTE — Patient Instructions (Signed)

## 2024-08-01 ENCOUNTER — Encounter: Payer: Self-pay | Admitting: Internal Medicine

## 2024-08-01 ENCOUNTER — Ambulatory Visit (AMBULATORY_SURGERY_CENTER): Admitting: Internal Medicine

## 2024-08-01 VITALS — BP 122/80 | HR 60 | Temp 97.7°F | Resp 8 | Ht 74.0 in | Wt 195.0 lb

## 2024-08-01 DIAGNOSIS — Z1211 Encounter for screening for malignant neoplasm of colon: Secondary | ICD-10-CM

## 2024-08-01 DIAGNOSIS — K635 Polyp of colon: Secondary | ICD-10-CM | POA: Diagnosis not present

## 2024-08-01 DIAGNOSIS — D122 Benign neoplasm of ascending colon: Secondary | ICD-10-CM

## 2024-08-01 DIAGNOSIS — Z860100 Personal history of colon polyps, unspecified: Secondary | ICD-10-CM

## 2024-08-01 MED ORDER — SODIUM CHLORIDE 0.9 % IV SOLN: 500.0000 mL | Freq: Once | INTRAVENOUS | Status: DC

## 2024-08-01 NOTE — Progress Notes (Signed)
 Sedate, gd SR, tolerated procedure well, VSS, report to RN

## 2024-08-01 NOTE — Op Note (Addendum)
 [facility] Patient Name: Ethan Glenn Procedure Date: 08/01/2024 7:23 AM MRN: [id number] Endoscopist: Norleen SAILOR. Abran , MD, [id number] Age: 63 Referring MD:  Date of Birth: September 27, 1960 Gender: Male Account #: [id number] Procedure:                Colonoscopy with cold snare polypectomy x 1 Indications:              High risk colon cancer surveillance: Personal                            history of colonic polyps. Previous examinations in                            [place]  2015, 2020 Medicines:                Monitored Anesthesia Care Procedure:                Pre-Anesthesia Assessment:                           - Prior to the procedure, a History and Physical                            was performed, and patient medications and                            allergies were reviewed. The patient's tolerance of                            previous anesthesia was also reviewed. The risks                            and benefits of the procedure and the sedation                            options and risks were discussed with the patient.                            All questions were answered, and informed consent                            was obtained. Prior Anticoagulants: The patient has                            taken no anticoagulant or antiplatelet agents. ASA                            Grade Assessment: II - A patient with mild systemic                            disease. After reviewing the risks and benefits,                            the patient was deemed in satisfactory condition to  undergo the procedure.                           After obtaining informed consent, the colonoscope                            was passed under direct vision. Throughout the                            procedure, the patient's blood pressure, pulse, and                            oxygen saturations were monitored continuously. The                            CF  [id number] #[id number] was introduced through the anus                            and advanced to the the cecum, identified by                            appendiceal orifice and ileocecal valve. The                            ileocecal valve, appendiceal orifice, and rectum                            were photographed. The quality of the bowel                            preparation was excellent. The colonoscopy was                            performed without difficulty. The patient tolerated                            the procedure well. The bowel preparation used was                            SUPREP via split dose instruction. Scope In: 8:23:49 AM Scope Out: 8:37:55 AM Scope Withdrawal Time: 0 hours 11 minutes 4 seconds  Total Procedure Duration: 0 hours 14 minutes 6 seconds  Findings:                 A 4 mm polyp was found in the ascending colon. The                            polyp was sessile. The polyp was removed with a                            cold snare. Resection and retrieval were complete.                           The exam was otherwise without abnormality on  direct and retroflexion views. Complications:            No immediate complications. Estimated blood loss:                            None. Estimated Blood Loss:     Estimated blood loss: none. Impression:               - One 4 mm polyp in the ascending colon, removed                            with a cold snare. Resected and retrieved.                           - The examination was otherwise normal on direct                            and retroflexion views. Recommendation:           - Repeat colonoscopy in 5 years for surveillance.                           - Patient has a contact number available for                            emergencies. The signs and symptoms of potential                            delayed complications were discussed with the                            patient. Return to  normal activities tomorrow.                            Written discharge instructions were provided to the                            patient.                           - Resume previous diet.                           - Continue present medications.                           - Await pathology results. Norleen SAILOR. Abran, MD 08/01/2024 8:45:52 AM This report has been signed electronically.

## 2024-08-01 NOTE — Patient Instructions (Signed)
 Thank you for letting us  take care of your healthcare needs today. Please see handouts given to you on Polyps. Resume previous diet and medications.    YOU HAD AN ENDOSCOPIC PROCEDURE TODAY AT THE [facility]:   Refer to the procedure report that was given to you for any specific questions about what was found during the examination.  If the procedure report does not answer your questions, please call your gastroenterologist to clarify.  If you requested that your care partner not be given the details of your procedure findings, then the procedure report has been included in a sealed envelope for you to review at your convenience later.  YOU SHOULD EXPECT: Some feelings of bloating in the abdomen. Passage of more gas than usual.  Walking can help get rid of the air that was put into your GI tract during the procedure and reduce the bloating. If you had a lower endoscopy (such as a colonoscopy or flexible sigmoidoscopy) you may notice spotting of blood in your stool or on the toilet paper. If you underwent a bowel prep for your procedure, you may not have a normal bowel movement for a few days.  Please Note:  You might notice some irritation and congestion in your nose or some drainage.  This is from the oxygen used during your procedure.  There is no need for concern and it should clear up in a day or so.  SYMPTOMS TO REPORT IMMEDIATELY:  Following lower endoscopy (colonoscopy or flexible sigmoidoscopy):  Excessive amounts of blood in the stool  Significant tenderness or worsening of abdominal pains  Swelling of the abdomen that is new, acute  Fever of 100F or higher  For urgent or emergent issues, a gastroenterologist can be reached at any hour by calling [phone]. Do not use MyChart messaging for urgent concerns.    DIET:  We do recommend a small meal at first, but then you may proceed to your regular diet.  Drink plenty of fluids but you should avoid alcoholic beverages  for 24 hours.  ACTIVITY:  You should plan to take it easy for the rest of today and you should NOT DRIVE or use heavy machinery until tomorrow (because of the sedation medicines used during the test).    FOLLOW UP: Our staff will call the number listed on your records the next business day following your procedure.  We will call around 7:15- 8:00 am to check on you and address any questions or concerns that you may have regarding the information given to you following your procedure. If we do not reach you, we will leave a message.     If any biopsies were taken you will be contacted by phone or by letter within the next 1-3 weeks.  Please call us  at [phone] if you have not heard about the biopsies in 3 weeks.    SIGNATURES/CONFIDENTIALITY: You and/or your care partner have signed paperwork which will be entered into your electronic medical record.  These signatures attest to the fact that that the information above on your After Visit Summary has been reviewed and is understood.  Full responsibility of the confidentiality of this discharge information lies with you and/or your care-partner.

## 2024-08-01 NOTE — Progress Notes (Signed)
 Called to room to assist during endoscopic procedure.  Patient ID and intended procedure confirmed with present staff. Received instructions for my participation in the procedure from the performing physician.

## 2024-08-01 NOTE — Progress Notes (Signed)
 HISTORY OF PRESENT ILLNESS:  Ethan Glenn is a 63 y.o. male with a history of colonoscopies elsewhere in 2015 and 2020.  Sent today for surveillance colonoscopy  REVIEW OF SYSTEMS:  All non-GI ROS negative except for  Past Medical History:  Diagnosis Date   ADHD    Allergic rhinitis    Anxiety    Frequent headaches    Migraine     Past Surgical History:  Procedure Laterality Date   COLONOSCOPY  04/03/2019   HERNIA REPAIR  1965   umbilical   KNEE SURGERY Left 1984   knee arthroscopic meniscectomy    Social History Ethan Glenn  reports that he quit smoking about 38 years ago. His smoking use included cigarettes. He has never used smokeless tobacco. He reports that he does not currently use alcohol after a past usage of about 3.0 standard drinks of alcohol per week. He reports that he does not use drugs.  family history includes Anxiety disorder in his sister; Glaucoma in his mother; Hypertension in his father; Ovarian cancer in his mother; Stroke in his father.  Allergies  Allergen Reactions   Quinolones Other (See Comments)    Aortic Aneurysm - should avoid Fluoroquinolone medications.   Erythromycin Nausea Only       PHYSICAL EXAMINATION: Vital signs: BP 124/76   Pulse 96   Temp 97.7 F (36.5 C)   Ht 6' 2 (1.88 m)   Wt 195 lb (88.5 kg)   SpO2 99%   BMI 25.04 kg/m  General: Well-developed, well-nourished, no acute distress HEENT: Sclerae are anicteric, conjunctiva pink. Oral mucosa intact Lungs: Clear Heart: Regular Abdomen: soft, nontender, nondistended, no obvious ascites, no peritoneal signs, normal bowel sounds. No organomegaly. Extremities: No edema Psychiatric: alert and oriented x3. Cooperative     ASSESSMENT:  History of colon polyps   PLAN:  Surveillance colonoscopy

## 2024-08-01 NOTE — Progress Notes (Signed)
 Pt's states no medical or surgical changes since previsit or office visit.

## 2024-08-02 ENCOUNTER — Telehealth: Payer: Self-pay

## 2024-08-02 NOTE — Telephone Encounter (Signed)
 Left message on answering machine.

## 2024-08-04 ENCOUNTER — Ambulatory Visit: Payer: Self-pay | Admitting: Internal Medicine

## 2024-08-04 LAB — SURGICAL PATHOLOGY

## 2024-08-21 ENCOUNTER — Encounter: Payer: Self-pay | Admitting: Family Medicine

## 2024-08-29 ENCOUNTER — Ambulatory Visit: Admitting: Family Medicine

## 2024-08-31 ENCOUNTER — Ambulatory Visit: Admitting: Family Medicine

## 2024-09-08 ENCOUNTER — Ambulatory Visit
Admission: RE | Admit: 2024-09-08 | Discharge: 2024-09-08 | Disposition: A | Discharge status: Home / Self Care | Source: Ambulatory Visit | Attending: Emergency Medicine | Admitting: Emergency Medicine

## 2024-09-08 VITALS — BP 107/67 | HR 65 | Temp 98.2°F | Resp 20

## 2024-09-08 DIAGNOSIS — J0100 Acute maxillary sinusitis, unspecified: Secondary | ICD-10-CM

## 2024-09-08 MED ORDER — IPRATROPIUM BROMIDE 0.03 % NA SOLN: 2.0000 | Freq: Two times a day (BID) | NASAL | 0 refills | Status: DC

## 2024-09-08 MED ORDER — AMOXICILLIN-POT CLAVULANATE 875-125 MG PO TABS: 1.0000 | ORAL_TABLET | Freq: Two times a day (BID) | ORAL | 0 refills | Status: DC

## 2024-09-08 MED ORDER — PREDNISONE 10 MG (21) PO TBPK: ORAL_TABLET | Freq: Every day | ORAL | 0 refills | Status: DC

## 2024-09-08 NOTE — ED Provider Notes (Signed)
 " Ethan Glenn    CSN: [id number] Arrival date & time: 09/08/24  1018      History   Chief Complaint Chief Complaint  Patient presents with   Nasal Congestion    This is day 8. Sore throat,  congestion, headache. Started to get better on day 4, but things have only gotten worse since then, especially sinuses. - Entered by patient   Sore Throat   Headache    HPI Ethan Glenn is a 63 y.o. male.   Patient presents for evaluation of nasal congestion, sinus pain and pressure to the bilateral cheeks, worse to the right side, sore throat, productive cough with yellow-tinged sputum with streaking blood and intermittent headaches present for 8 days.  Symptoms seem to be improving but have progressively worsened over the past 4 days with congestion becoming more prominent.  Has attempted use of Afrin, Tylenol, NyQuil and cough drops.  Tolerable to food and liquids.  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     Past Medical History:  Diagnosis Date   ADHD    Allergic rhinitis    Anxiety    Frequent headaches    Migraine     Patient Active Problem List   Diagnosis Date Noted   Chronic neck pain 03/11/2022   Osteoarthritis of multiple joints 03/11/2022   Muscle spasm of back 03/11/2022   Thoracic ascending aortic aneurysm 09/03/2021   Pure hypercholesterolemia 08/28/2021   Family history of cardiovascular disease 08/28/2021   Benign prostatic hyperplasia with nocturia 08/28/2021   Primary osteoarthritis of both hands 01/17/2021   Trigger finger of all digits of both hands 01/17/2021   Acquired trigger finger of right little finger 01/17/2021   Chronic thumb pain, bilateral 01/17/2021   Anxiety 08/07/2020   ADHD 08/07/2020   Headache 08/07/2020   Immunity status testing 08/07/2020    Past Surgical History:  Procedure Laterality Date   COLONOSCOPY  04/03/2019   HERNIA REPAIR  1965   umbilical   KNEE SURGERY Left 1984   knee arthroscopic meniscectomy        Home Medications    Prior to Admission medications  Medication Sig Start Date End Date Taking? Authorizing Provider  escitalopram  (LEXAPRO ) 20 MG tablet Take 1 tablet (20 mg total) by mouth daily. 03/20/24   Karamalegos, Marsa PARAS, DO  loratadine (CLARITIN) 10 MG tablet Take 10 mg by mouth daily.    [provider]  Multiple Vitamins-Minerals (PRESERVISION AREDS PO) Take by mouth daily.    [provider]  propranolol  ER (INDERAL  LA) 60 MG 24 hr capsule Take 1 capsule (60 mg total) by mouth daily. 06/12/24   Karamalegos, Marsa PARAS, DO  SUMAtriptan  (IMITREX ) 20 MG/ACT nasal spray Place 1 spray (20 mg total) into the nose daily as needed for migraine or headache. May repeat in 2 hours if headache persists or recurs. 02/23/24   Karamalegos, Marsa PARAS, DO  tamsulosin  (FLOMAX ) 0.4 MG CAPS capsule Take 1 capsule (0.4 mg total) by mouth daily after supper. 03/20/24   Edman Marsa PARAS, DO    Family History Family History  Problem Relation Age of Onset   Glaucoma Mother    Ovarian cancer Mother    Hypertension Father    Stroke Father    Anxiety disorder Sister    Colon cancer Neg Hx    Rectal cancer Neg Hx     Social History Social History[1]   Allergies   Quinolones and Erythromycin   Review of Systems  Review of Systems  Constitutional: Negative.   HENT:  Positive for congestion, sinus pressure, sinus pain and sore throat. Negative for dental problem, drooling, ear discharge, ear pain, facial swelling, hearing loss, mouth sores, nosebleeds, postnasal drip, rhinorrhea, sneezing, tinnitus, trouble swallowing and voice change.   Respiratory:  Positive for cough. Negative for apnea, choking, chest tightness, shortness of breath, wheezing and stridor.   Gastrointestinal: Negative.   Neurological:  Positive for headaches. Negative for dizziness, tremors, seizures, syncope, facial asymmetry, speech difficulty, weakness, light-headedness and numbness.      Physical Exam Triage Vital Signs ED Triage Vitals  Encounter Vitals Group     BP 09/08/24 1039 107/67     Girls Systolic BP Percentile --      Girls Diastolic BP Percentile --      Boys Systolic BP Percentile --      Boys Diastolic BP Percentile --      Pulse Rate 09/08/24 1039 65     Resp 09/08/24 1039 20     Temp 09/08/24 1039 98.2 F (36.8 C)     Temp Source 09/08/24 1039 Oral     SpO2 09/08/24 1039 97 %     Weight --      Height --      Head Circumference --      Peak Flow --      Pain Score 09/08/24 1042 3     Pain Loc --      Pain Education --      Exclude from Growth Chart --    No data found.  Updated Vital Signs BP 107/67 (BP Location: Left Arm)   Pulse 65   Temp 98.2 F (36.8 C) (Oral)   Resp 20   SpO2 97%   Visual Acuity Right Eye Distance:   Left Eye Distance:   Bilateral Distance:    Right Eye Near:   Left Eye Near:    Bilateral Near:     Physical Exam Constitutional:      Appearance: Normal appearance.  HENT:     Head: Normocephalic.     Right Ear: Tympanic membrane, ear canal and external ear normal.     Left Ear: Tympanic membrane, ear canal and external ear normal.     Nose: Congestion present.     Right Sinus: Maxillary sinus tenderness present.     Left Sinus: Maxillary sinus tenderness present.     Mouth/Throat:     Pharynx: No oropharyngeal exudate or posterior oropharyngeal erythema.  Eyes:     Extraocular Movements: Extraocular movements intact.  Cardiovascular:     Rate and Rhythm: Normal rate and regular rhythm.     Pulses: Normal pulses.     Heart sounds: Normal heart sounds.  Pulmonary:     Effort: Pulmonary effort is normal.     Breath sounds: Normal breath sounds.  Neurological:     Mental Status: He is alert and oriented to person, place, and time. Mental status is at baseline.      UC Treatments / Results  Labs (all labs ordered are listed, but only abnormal results are displayed) Labs Reviewed - No data  to display  EKG   Radiology No results found.  Procedures Procedures (including critical care time)  Medications Ordered in UC Medications - No data to display  Initial Impression / Assessment and Plan / UC Course  I have reviewed the triage vital signs and the nursing notes.  Pertinent labs & imaging results that were available during  my care of the patient were reviewed by me and considered in my medical decision making (see chart for details).  Acute nonrecurrent maxillary sinusitis  Patient is in no signs of distress nor toxic appearing.  Vital signs are stable.  Low suspicion for pneumonia, pneumothorax or bronchitis and therefore will defer imaging.  Viral testing deferred due to timeline.  Presentation consistent with a sinusitis ,prescribed Augmentin  prednisone  and ipratropium nasal spray. May use additional over-the-counter medications as needed for supportive care.  May follow-up with urgent care as needed if symptoms persist or worsen.  Final Clinical Impressions(s) / UC Diagnoses   Final diagnoses:  None   Discharge Instructions   None    ED Prescriptions   None    PDMP not reviewed this encounter.     [1]  Social History Tobacco Use   Smoking status: Former    Current packs/day: 0.00    Types: Cigarettes    Quit date: 08/07/1986    Years since quitting: 38.1   Smokeless tobacco: Never  Vaping Use   Vaping status: Never Used  Substance Use Topics   Alcohol use: Not Currently    Alcohol/week: 3.0 standard drinks of alcohol    Types: 3 Standard drinks or equivalent per week   Drug use: Never     Teresa Shelba SAUNDERS, NP 09/08/24 1115  "

## 2024-09-08 NOTE — Discharge Instructions (Signed)
 Begin Augmentin  twice daily for 7 days for treatment of bacteria causing symptoms to worsen  Begin prednisone  every morning with food to help reduce inflammation throughout the sinuses, avoid ibuprofen while taking but may use Tylenol  You may use ipratropium nasal spray twice daily to further help reduce sinus pressure    You can take Tylenol as needed for fever reduction and pain relief.   For cough: honey 1/2 to 1 teaspoon (you can dilute the honey in water or another fluid).  You can also use guaifenesin and dextromethorphan for cough. You can use a humidifier for chest congestion and cough.  If you don't have a humidifier, you can sit in the bathroom with the hot shower running.      For sore throat: try warm salt water gargles, cepacol lozenges, throat spray, warm tea or water with lemon/honey, popsicles or ice, or OTC cold relief medicine for throat discomfort.   For congestion: take a daily anti-histamine like Zyrtec , Claritin, and a oral decongestant, such as pseudoephedrine .  You can also use Flonase 1-2 sprays in each nostril daily.   It is important to stay hydrated: drink plenty of fluids (water, gatorade/powerade/pedialyte, juices, or teas) to keep your throat moisturized and help further relieve irritation/discomfort.

## 2024-09-08 NOTE — ED Triage Notes (Signed)
 Patient complains of sore throat, nasal congestion and headache x 8 days. Patient has taken Afrin and Tylenol with mild relief. Rates sore throat pain 3/10 and headache 3/10.

## 2024-09-11 ENCOUNTER — Other Ambulatory Visit: Payer: Self-pay | Admitting: Family Medicine

## 2024-09-11 DIAGNOSIS — R000 Tachycardia, unspecified: Secondary | ICD-10-CM

## 2024-09-11 DIAGNOSIS — F419 Anxiety disorder, unspecified: Secondary | ICD-10-CM

## 2024-10-09 ENCOUNTER — Encounter: Payer: Self-pay | Admitting: Family Medicine

## 2024-12-07 ENCOUNTER — Ambulatory Visit: Admitting: Dermatology

## 2024-12-07 ENCOUNTER — Ambulatory Visit

## 2024-12-21 ENCOUNTER — Ambulatory Visit: Admitting: Urology

## 2025-03-20 ENCOUNTER — Other Ambulatory Visit

## 2025-03-27 ENCOUNTER — Encounter: Admitting: Family Medicine
